# Patient Record
Sex: Male | Born: 1935 | Race: White | Hispanic: No | Marital: Married | Smoking: Never smoker
Health system: Southern US, Community
[De-identification: ages and names within clinical notes are randomized; demographics above are authoritative.]

## PROBLEM LIST (undated history)

## (undated) DIAGNOSIS — M1990 Unspecified osteoarthritis, unspecified site: Secondary | ICD-10-CM

## (undated) DIAGNOSIS — E119 Type 2 diabetes mellitus without complications: Secondary | ICD-10-CM

## (undated) DIAGNOSIS — Z85828 Personal history of other malignant neoplasm of skin: Secondary | ICD-10-CM

## (undated) DIAGNOSIS — R350 Frequency of micturition: Secondary | ICD-10-CM

## (undated) DIAGNOSIS — K219 Gastro-esophageal reflux disease without esophagitis: Secondary | ICD-10-CM

## (undated) DIAGNOSIS — C189 Malignant neoplasm of colon, unspecified: Secondary | ICD-10-CM

## (undated) DIAGNOSIS — B029 Zoster without complications: Secondary | ICD-10-CM

## (undated) DIAGNOSIS — G709 Myoneural disorder, unspecified: Secondary | ICD-10-CM

## (undated) DIAGNOSIS — I10 Essential (primary) hypertension: Secondary | ICD-10-CM

## (undated) DIAGNOSIS — C9110 Chronic lymphocytic leukemia of B-cell type not having achieved remission: Secondary | ICD-10-CM

## (undated) DIAGNOSIS — G4730 Sleep apnea, unspecified: Secondary | ICD-10-CM

## (undated) DIAGNOSIS — E039 Hypothyroidism, unspecified: Secondary | ICD-10-CM

## (undated) HISTORY — DX: Chronic lymphocytic leukemia of B-cell type not having achieved remission: C91.10

## (undated) HISTORY — DX: Zoster without complications: B02.9

## (undated) HISTORY — DX: Essential (primary) hypertension: I10

## (undated) HISTORY — DX: Hypothyroidism, unspecified: E03.9

---

## 1978-02-09 HISTORY — PX: HERNIA REPAIR: SHX51

## 2008-02-10 HISTORY — PX: COLON SURGERY: SHX602

## 2012-02-17 ENCOUNTER — Encounter: Payer: Self-pay | Admitting: Cardiology

## 2012-02-17 ENCOUNTER — Ambulatory Visit (INDEPENDENT_AMBULATORY_CARE_PROVIDER_SITE_OTHER): Payer: Medicare Other | Admitting: Cardiology

## 2012-02-17 VITALS — BP 140/68 | HR 84 | Ht 68.0 in | Wt 184.1 lb

## 2012-02-17 DIAGNOSIS — I10 Essential (primary) hypertension: Secondary | ICD-10-CM

## 2012-02-17 DIAGNOSIS — B029 Zoster without complications: Secondary | ICD-10-CM

## 2012-02-17 DIAGNOSIS — C9110 Chronic lymphocytic leukemia of B-cell type not having achieved remission: Secondary | ICD-10-CM

## 2012-02-17 DIAGNOSIS — R079 Chest pain, unspecified: Secondary | ICD-10-CM

## 2012-02-17 NOTE — Patient Instructions (Signed)
We will schedule you for a stress test  

## 2012-02-17 NOTE — Progress Notes (Signed)
   Lawrence Wilson Date of Birth: [birth year] Medical Record #[id number]  History of Present Illness: Lawrence Wilson is a pleasant 79 year old white male seen at the request of his son who is an emergency room physician here for evaluation of chest pain. He has a history of hypertension, hypothyroidism, and CLL. In mid-December he developed shingles involving his mid thorax and abdomen. Since this has occurred he has developed symptoms with exertion of sweating around his shoulders and chest. This is reproducible occurs several times a day. It may last as long as 20-30 minutes He denies any significant pain but does describe an unusual sensation in his chest with exertion. This can be with simple activities such as making a bed. He has no prior cardiac history. He apparently had a Stress test about 8 years ago and also had lower extremity Doppler studies which were normal.  Current Outpatient Prescriptions on File Prior to Visit  Medication Sig Dispense Refill  . levothyroxine (SYNTHROID, LEVOTHROID) 25 MCG tablet Take 25 mcg by mouth daily.      Marland Kitchen lisinopril-hydrochlorothiazide (PRINZIDE,ZESTORETIC) 20-12.5 MG per tablet Take 1 tablet by mouth daily.      . tadalafil (CIALIS) 20 MG tablet Take 20 mg by mouth daily as needed.        No Known Allergies  Past Medical History  Diagnosis Date  . Hypertension   . Hypothyroid   . CLL (chronic lymphocytic leukemia)   . Shingles     Past Surgical History  Procedure Date  . Hernia repair 1980  . Colon surgery 2010    cancerous polyp    History  Smoking status  . Never Smoker   Smokeless tobacco  . Not on file    History  Alcohol Use     History reviewed. No pertinent family history.  Review of Systems: The review of systems is positive for  recent shingles outbreak on his left trunk and abdomen.complains of numbness in his fingertips. He has been diagnosed with a low-grade CLL which is currently not being treated.   All other systems were  reviewed and are negative.  Physical Exam: BP 140/68  Pulse 84  Ht 5\' 8"  (1.727 m)  Wt 184 lb 1.9 oz (83.516 kg)  BMI 28.00 kg/m2 He is a pleasant white male in no acute distress. HEENT: Normocephalic, atraumatic. Pupils are equal round and reactive to light accommodation. Extraocular movements are full. His conjunctiva are reddened. Oropharynx is clear. Neck is without jugular venous distention, adenopathy, thyromegaly, or bruits. Lungs: Clear Cardiovascular: Regular rate and rhythm, normal S1 and S2, no gallop, murmur, or click. Abdomen: Soft and nontender. No masses or hepatosplenomegaly. Bowel sounds are positive. Extremities: No cyanosis or edema. Pedal pulses are 2+ and symmetric. Skin: Extensive scabbing herpetic rash involving the mid thorax radiating to the mid abdomen. Neuro: Alert and oriented x3. Cranial nerves II through XII are intact.  LABORATORY DATA: ECG today demonstrates normal sinus rhythm with rare PACs. Otherwise normal.  Assessment / Plan: 1. Atypical chest discomfort but exertional. Patient's risk factors include hypertension. I recommended a stress Myoview to further evaluate his cardiovascular risk. 2. Herpes zoster infection 3. CLL 4. Hypertension

## 2012-02-24 ENCOUNTER — Ambulatory Visit (HOSPITAL_COMMUNITY): Payer: Medicare Other | Attending: Cardiovascular Disease | Admitting: Radiology

## 2012-02-24 VITALS — BP 146/66 | HR 82 | Ht 69.0 in | Wt 184.0 lb

## 2012-02-24 DIAGNOSIS — R61 Generalized hyperhidrosis: Secondary | ICD-10-CM | POA: Insufficient documentation

## 2012-02-24 DIAGNOSIS — R079 Chest pain, unspecified: Secondary | ICD-10-CM

## 2012-02-24 DIAGNOSIS — I4949 Other premature depolarization: Secondary | ICD-10-CM

## 2012-02-24 DIAGNOSIS — C9110 Chronic lymphocytic leukemia of B-cell type not having achieved remission: Secondary | ICD-10-CM

## 2012-02-24 DIAGNOSIS — R0602 Shortness of breath: Secondary | ICD-10-CM

## 2012-02-24 DIAGNOSIS — I10 Essential (primary) hypertension: Secondary | ICD-10-CM | POA: Insufficient documentation

## 2012-02-24 DIAGNOSIS — B029 Zoster without complications: Secondary | ICD-10-CM

## 2012-02-24 MED ORDER — TECHNETIUM TC 99M SESTAMIBI GENERIC - CARDIOLITE
11.0000 | Freq: Once | INTRAVENOUS | Status: AC | PRN
  Administered 2012-02-24: 11 via INTRAVENOUS

## 2012-02-24 MED ORDER — TECHNETIUM TC 99M SESTAMIBI GENERIC - CARDIOLITE
33.0000 | Freq: Once | INTRAVENOUS | Status: AC | PRN
  Administered 2012-02-24: 33 via INTRAVENOUS

## 2012-02-24 NOTE — Progress Notes (Signed)
[facility] SITE 3 NUCLEAR MED [address] [place], [place] [address] [phone]    Cardiology Nuclear Med Study  Lawrence Wilson is a 79 y.o. male     MRN : [id number]     DOB: 1929/08/30  Procedure Date: 02/24/2012  Nuclear Med Background Indication for Stress Test:  Evaluation for Ischemia History:  '04 QMV:HQIONG Cardiac Risk Factors: Hypertension  Symptoms:  Diaphoresis with Exertion (last episode was yesterday in [employer], denies any chest pain)   Nuclear Pre-Procedure Caffeine/Decaff Intake:  None NPO After: 7:30am   Lungs:  Clear. O2 Sat: 97% on room air. IV 0.9% NS with Angio Cath:  22g  IV Site: R Antecubital  IV Started by:  Bonnita Levan, RN  Chest Size (in):  46 Cup Size: n/a  Height: 5\' 9"  (1.753 m)  Weight:  184 lb (83.462 kg)  BMI:  Body mass index is 27.17 kg/(m^2). Tech Comments:  N/A    Nuclear Med Study 1 or 2 day study: 1 day  Stress Test Type:  Stress  Reading MD: Kristeen Miss, MD  Order Authorizing Provider:  Peter Swaziland, MD  Resting Radionuclide: Technetium 62m Sestamibi  Resting Radionuclide Dose: 10.9 mCi   Stress Radionuclide:  Technetium 79m Sestamibi  Stress Radionuclide Dose: 32.8 mCi           Stress Protocol Rest HR: 82 Stress HR: 148  Rest BP: 146/66 Stress BP: 200/52  Exercise Time (min): 6:00 METS: 7.0   Predicted Max HR: 140 bpm % Max HR: 105.71 bpm Rate Pressure Product: 29528    Dose of Adenosine (mg):  n/a Dose of Lexiscan: n/a mg  Dose of Atropine (mg): n/a Dose of Dobutamine: n/a mcg/kg/min (at max HR)  Stress Test Technologist: Smiley Houseman, CMA-N  Nuclear Technologist:  Domenic Polite, CNMT     Rest Procedure:  Myocardial perfusion imaging was performed at rest 45 minutes following the intravenous administration of Technetium 66m Sestamibi.  Rest ECG: NSR - Normal EKG  Stress Procedure:  The patient exercised on the treadmill utilizing the Bruce Protocol for six minutes. The patient stopped due to dyspnea  and denied any chest pain.  Technetium 75m Sestamibi was injected at peak exercise and myocardial perfusion imaging was performed after a brief delay.  Stress ECG: No significant change from baseline ECG  QPS Raw Data Images:  Normal; no motion artifact; normal heart/lung ratio. Stress Images:  Normal homogeneous uptake in all areas of the myocardium. Rest Images:  Normal homogeneous uptake in all areas of the myocardium. Subtraction (SDS):  No evidence of ischemia. Transient Ischemic Dilatation (Normal <1.22):  0.97 Lung/Heart Ratio (Normal <0.45):  0.28  Quantitative Gated Spect Images QGS EDV:  82 ml QGS ESV:  29 ml  Impression Exercise Capacity:  Good exercise capacity. BP Response:  Hypertensive blood pressure response. Clinical Symptoms:  No significant symptoms noted. ECG Impression:  No significant ST segment change suggestive of ischemia. Comparison with Prior Nuclear Study: No previous nuclear study performed  Overall Impression:  Normal stress nuclear study.  No evidence of ischemia.  LV Ejection Fraction: 65%.  LV Wall Motion:  NL LV Function; NL Wall Motion.    Vesta Mixer, Montez Hageman., MD, [facility] 02/24/2012, 4:52 PM Office - [phone] Pager [phone]

## 2012-03-27 ENCOUNTER — Encounter (HOSPITAL_BASED_OUTPATIENT_CLINIC_OR_DEPARTMENT_OTHER): Payer: Self-pay | Admitting: Emergency Medicine

## 2012-03-27 ENCOUNTER — Emergency Department (HOSPITAL_BASED_OUTPATIENT_CLINIC_OR_DEPARTMENT_OTHER)
Admission: EM | Admit: 2012-03-27 | Discharge: 2012-03-27 | Disposition: A | Discharge status: Home / Self Care | Payer: Medicare Other | Attending: Emergency Medicine | Admitting: Emergency Medicine

## 2012-03-27 DIAGNOSIS — H538 Other visual disturbances: Secondary | ICD-10-CM | POA: Insufficient documentation

## 2012-03-27 DIAGNOSIS — R55 Syncope and collapse: Secondary | ICD-10-CM | POA: Insufficient documentation

## 2012-03-27 DIAGNOSIS — R51 Headache: Secondary | ICD-10-CM | POA: Insufficient documentation

## 2012-03-27 DIAGNOSIS — Z872 Personal history of diseases of the skin and subcutaneous tissue: Secondary | ICD-10-CM | POA: Insufficient documentation

## 2012-03-27 DIAGNOSIS — R42 Dizziness and giddiness: Secondary | ICD-10-CM | POA: Insufficient documentation

## 2012-03-27 DIAGNOSIS — Z8619 Personal history of other infectious and parasitic diseases: Secondary | ICD-10-CM | POA: Insufficient documentation

## 2012-03-27 DIAGNOSIS — R109 Unspecified abdominal pain: Secondary | ICD-10-CM | POA: Insufficient documentation

## 2012-03-27 DIAGNOSIS — R21 Rash and other nonspecific skin eruption: Secondary | ICD-10-CM | POA: Insufficient documentation

## 2012-03-27 DIAGNOSIS — Z79899 Other long term (current) drug therapy: Secondary | ICD-10-CM | POA: Insufficient documentation

## 2012-03-27 DIAGNOSIS — E039 Hypothyroidism, unspecified: Secondary | ICD-10-CM | POA: Insufficient documentation

## 2012-03-27 DIAGNOSIS — I10 Essential (primary) hypertension: Secondary | ICD-10-CM | POA: Insufficient documentation

## 2012-03-27 DIAGNOSIS — D72829 Elevated white blood cell count, unspecified: Secondary | ICD-10-CM

## 2012-03-27 LAB — URINALYSIS, ROUTINE W REFLEX MICROSCOPIC
Bilirubin Urine: NEGATIVE
Glucose, UA: NEGATIVE mg/dL
Hgb urine dipstick: NEGATIVE
Ketones, ur: NEGATIVE mg/dL
Leukocytes, UA: NEGATIVE
Protein, ur: NEGATIVE mg/dL
pH: 5.5 (ref 5.0–8.0)

## 2012-03-27 LAB — CBC WITH DIFFERENTIAL/PLATELET
Basophils Absolute: 0 10*3/uL (ref 0.0–0.1)
Basophils Relative: 0 % (ref 0–1)
Eosinophils Absolute: 0 10*3/uL (ref 0.0–0.7)
HCT: 38.1 % — ABNORMAL LOW (ref 39.0–52.0)
Hemoglobin: 12.5 g/dL — ABNORMAL LOW (ref 13.0–17.0)
Lymphs Abs: 30.4 10*3/uL — ABNORMAL HIGH (ref 0.7–4.0)
MCH: 31 pg (ref 26.0–34.0)
MCHC: 32.8 g/dL (ref 30.0–36.0)
MCV: 94.5 fL (ref 78.0–100.0)
Monocytes Absolute: 1.2 10*3/uL — ABNORMAL HIGH (ref 0.1–1.0)
Neutro Abs: 9.5 10*3/uL — ABNORMAL HIGH (ref 1.7–7.7)
RDW: 15.9 % — ABNORMAL HIGH (ref 11.5–15.5)

## 2012-03-27 LAB — BASIC METABOLIC PANEL
BUN: 33 mg/dL — ABNORMAL HIGH (ref 6–23)
Chloride: 101 mEq/L (ref 96–112)
Creatinine, Ser: 1.3 mg/dL (ref 0.50–1.35)
GFR calc Af Amer: 58 mL/min — ABNORMAL LOW (ref >90)
Glucose, Bld: 136 mg/dL — ABNORMAL HIGH (ref 70–99)
Potassium: 4.7 mEq/L (ref 3.5–5.1)

## 2012-03-27 MED ORDER — SODIUM CHLORIDE 0.9 % IV BOLUS (SEPSIS)
500.0000 mL | Freq: Once | INTRAVENOUS | Status: AC
  Administered 2012-03-27: 500 mL via INTRAVENOUS

## 2012-03-27 NOTE — ED Provider Notes (Signed)
History  This chart was scribed for Lawrence Racer, MD by Bennett Scrape, ED Scribe. This patient was seen in room MH05/MH05 and the patient's care was started at 12:48 PM.  CSN: [id number]  Arrival date & time 03/27/12  1231   First MD Initiated Contact with Patient 03/27/12 1248      Chief Complaint  Patient presents with  . Near Syncope     Patient is a 79 y.o. male presenting with syncope. The history is provided by the patient. No language interpreter was used.  Loss of Consciousness  This is a new problem. The current episode started less than 1 hour ago. The problem has been resolved. He lost consciousness for a period of less than one minute. The problem is associated with an unknown factor. Associated symptoms include abdominal pain (associated with rash, no new changes), headaches, light-headedness and visual change. Pertinent negatives include chest pain, fever, nausea, seizures and vomiting.    Lawrence Wilson is a 79 y.o. male brought in by ambulance, who presents to the Emergency Department complaining of one episode of syncope with associated precursor symptoms of lightheadedness, mild HA and blurred vision that occurred while at church less than one hour PTA. The episode was witnessed by pt's wife who states that he was standing and then sat down in the pew. Pt denies extended periods of standing and states that he had been feeling lightheaded for 15 minutes prior to the episode. He denies neck or head trauma. He reports that he took one Cialis pill at 8 AM along with his other daily medications (lisionpril and synthroid). He denies having prior episodes of similar symptoms. He states that he has improving an improving zoster rash to his abdomen but denies any recent fevers, chills, nausea and emesis as associated symptoms. He has a h/o HTN, CLL and hypothyroidism and he denies smoking.  Has been seen by Dr. Swaziland at [facility] before during herpes zoster onset in December  2013   Past Medical History  Diagnosis Date  . Hypertension   . Hypothyroid   . CLL (chronic lymphocytic leukemia)   . Shingles     Past Surgical History  Procedure Laterality Date  . Hernia repair  1980  . Colon surgery  2010    cancerous polyp    No family history on file.  History  Substance Use Topics  . Smoking status: Never Smoker   . Smokeless tobacco: Not on file  . Alcohol Use:       Review of Systems  Constitutional: Negative for fever and chills.  Cardiovascular: Positive for syncope. Negative for chest pain.  Gastrointestinal: Positive for abdominal pain (associated with rash, no new changes). Negative for nausea and vomiting.  Skin: Positive for pallor (ongoing, no new changes).  Neurological: Positive for syncope, light-headedness and headaches. Negative for seizures and speech difficulty.  All other systems reviewed and are negative.    Allergies  Review of patient's allergies indicates no known allergies.  Home Medications   Current Outpatient Rx  Name  Route  Sig  Dispense  Refill  . tadalafil (CIALIS) 20 MG tablet   Oral   Take 20 mg by mouth daily as needed.         Marland Kitchen levothyroxine (SYNTHROID, LEVOTHROID) 25 MCG tablet   Oral   Take 25 mcg by mouth daily.         Marland Kitchen lisinopril-hydrochlorothiazide (PRINZIDE,ZESTORETIC) 20-12.5 MG per tablet   Oral   Take 1 tablet by mouth  daily.         . Tamsulosin HCl (FLOMAX) 0.4 MG CAPS   Oral   Take 0.4 mg by mouth daily after supper.         . traMADol (ULTRAM) 50 MG tablet   Oral   Take 50 mg by mouth every 6 (six) hours as needed.           BP 119/53  Pulse 70  Temp(Src) 98 F (36.7 C) (Oral)  Resp 22  SpO2 100%  Physical Exam  Nursing note and vitals reviewed. Constitutional: He is oriented to person, place, and time. He appears well-developed and well-nourished. No distress.  HENT:  Head: Normocephalic and atraumatic.  Mouth/Throat: Oropharynx is clear and moist.   Eyes: Conjunctivae and EOM are normal. Pupils are equal, round, and reactive to light.  Neck: Neck supple. No tracheal deviation present.  Cardiovascular: Normal rate and regular rhythm.   No murmur heard. Pulmonary/Chest: Effort normal and breath sounds normal. No respiratory distress.  Abdominal: Soft. There is no tenderness.  Musculoskeletal: Normal range of motion.  Neurological: He is alert and oriented to person, place, and time. No cranial nerve deficit.  Sensation is intact, 5/5 strength throughout, equal grip strengths   Skin: Skin is warm and dry. Rash noted.  Old healing zoster scar to the left abdomen  Psychiatric: He has a normal mood and affect. His behavior is normal.    ED Course  Procedures (including critical care time)  DIAGNOSTIC STUDIES COORDINATION OF CARE: 1:12 PM-Discussed treatment plan which includes IV fluids, CBC panel, UA and troponin with pt at bedside and pt agreed to plan.   1:15 PM- Ordered 500 mL of bolus  Labs Reviewed  BASIC METABOLIC PANEL - Abnormal; Notable for the following:    Glucose, Bld 136 (*)    BUN 33 (*)    GFR calc non Af Amer 50 (*)    GFR calc Af Amer 58 (*)    All other components within normal limits  CBC WITH DIFFERENTIAL - Abnormal; Notable for the following:    WBC 41.1 (*)    RBC 4.03 (*)    Hemoglobin 12.5 (*)    HCT 38.1 (*)    RDW 15.9 (*)    Neutrophils Relative 23 (*)    Lymphocytes Relative 74 (*)    Neutro Abs 9.5 (*)    Lymphs Abs 30.4 (*)    Monocytes Absolute 1.2 (*)    All other components within normal limits  GLUCOSE, CAPILLARY - Abnormal; Notable for the following:    Glucose-Capillary 118 (*)    All other components within normal limits  TROPONIN I  URINALYSIS, ROUTINE W REFLEX MICROSCOPIC  PATHOLOGIST SMEAR REVIEW   No results found.   1. Near syncope   2. Leukocytosis       Date: 03/27/2012  Rate: 76  Rhythm: normal sinus rhythm and sinus arrhythmia  QRS Axis: normal  Intervals:  normal  ST/T Wave abnormalities: normal  Conduction Disutrbances:none  Narrative Interpretation:   Old EKG Reviewed: unchanged   MDM  I personally performed the services described in this documentation, which was scribed in my presence. The recorded information has been reviewed and is accurate.  Pt runs elevated WBC due to    Lawrence Racer, MD 03/30/12 [id number]

## 2012-03-27 NOTE — ED Notes (Signed)
Pt standing at church, became dizzy and vision blurry.  Pt states he remembers everything.  Pt did not eat breakfast.

## 2012-03-27 NOTE — ED Notes (Signed)
Pt ambulated in the hallway with no return of dizziness.

## 2012-09-14 ENCOUNTER — Other Ambulatory Visit: Payer: Self-pay

## 2012-12-15 ENCOUNTER — Other Ambulatory Visit: Payer: Self-pay

## 2013-02-09 HISTORY — PX: CATARACT EXTRACTION: SUR2

## 2014-02-20 ENCOUNTER — Ambulatory Visit: Payer: Self-pay | Admitting: Orthopedic Surgery

## 2014-02-20 NOTE — Progress Notes (Signed)
Please put orders in Epic surgery 03-05-14 pre op 02-26-14 Thanks 

## 2014-02-20 NOTE — Progress Notes (Signed)
Preoperative surgical orders have been place into the Epic hospital system for Lawrence Wilson on 02/20/2014, 1:52 PM  by Mickel Crow for surgery on 03-05-2014.  Preop Total Knee orders including Experal, IV Tylenol, and IV Decadron as long as there are no contraindications to the above medications. Arlee Muslim, PA-C

## 2014-02-21 ENCOUNTER — Encounter (HOSPITAL_COMMUNITY): Payer: Self-pay

## 2014-02-23 NOTE — Patient Instructions (Addendum)
Lawrence Wilson  02/23/2014   Your procedure is scheduled on: 03/05/2014    Report to [facility] Main  Entrance and follow signs to               [place] at     1045 AM.  Call this number if you have problems the morning of surgery [phone]   Remember: May have clear liquids until 0700am then nothing by mouth.    Do not eat food after midnite.       Take these medicines the morning of surgery with A SIP OF WATER:Synthroid                                 You may not have any metal on your body including hair pins and              piercings  Do not wear jewelry,  lotions, powders or perfumes., deodorant                          Men may shave face and neck.   Do not bring valuables to the hospital. [place].  Contacts, dentures or bridgework may not be worn into surgery.  Leave suitcase in the car. After surgery it may be brought to your room.       Special Instructions:coughing and deep breathing exercises, leg exercises               Please read over the following fact sheets you were given: _____________________________________________________________________                CLEAR LIQUID DIET   Foods Allowed                                                                     Foods Excluded  Coffee and tea, regular and decaf                             liquids that you cannot  Plain Jell-O in any flavor                                             see through such as: Fruit ices (not with fruit pulp)                                     milk, soups, orange juice  Iced Popsicles                                    All solid food Carbonated beverages, regular and diet  Cranberry, grape and apple juices Sports drinks like Gatorade Lightly seasoned clear broth or consume(fat free) Sugar, honey syrup  Sample Menu Breakfast                                Lunch                                      Supper Cranberry juice                    Beef broth                            Chicken broth Jell-O                                     Grape juice                           Apple juice Coffee or tea                        Jell-O                                      Popsicle                                                Coffee or tea                        Coffee or tea  _____________________________________________________________________  [facility] - Preparing for Surgery Before surgery, you can play an important role.  Because skin is not sterile, your skin needs to be as free of germs as possible.  You can reduce the number of germs on your skin by washing with CHG (chlorahexidine gluconate) soap before surgery.  CHG is an antiseptic cleaner which kills germs and bonds with the skin to continue killing germs even after washing. Please DO NOT use if you have an allergy to CHG or antibacterial soaps.  If your skin becomes reddened/irritated stop using the CHG and inform your nurse when you arrive at Short Stay. Do not shave (including legs and underarms) for at least 48 hours prior to the first CHG shower.  You may shave your face/neck. Please follow these instructions carefully:  1.  Shower with CHG Soap the night before surgery and the  morning of Surgery.  2.  If you choose to wash your hair, wash your hair first as usual with your  normal  shampoo.  3.  After you shampoo, rinse your hair and body thoroughly to remove the  shampoo.                           4.  Use CHG as you would any other liquid soap.  You can apply chg directly  to the skin and wash  Gently with a scrungie or clean washcloth.  5.  Apply the CHG Soap to your body ONLY FROM THE NECK DOWN.   Do not use on face/ open                           Wound or open sores. Avoid contact with eyes, ears mouth and genitals (private parts).                       Wash face,   Genitals (private parts) with your normal soap.             6.  Wash thoroughly, paying special attention to the area where your surgery  will be performed.  7.  Thoroughly rinse your body with warm water from the neck down.  8.  DO NOT shower/wash with your normal soap after using and rinsing off  the CHG Soap.                9.  Pat yourself dry with a clean towel.            10.  Wear clean pajamas.            11.  Place clean sheets on your bed the night of your first shower and do not  sleep with pets. Day of Surgery : Do not apply any lotions/deodorants the morning of surgery.  Please wear clean clothes to the hospital/surgery center.  FAILURE TO FOLLOW THESE INSTRUCTIONS MAY RESULT IN THE CANCELLATION OF YOUR SURGERY PATIENT SIGNATURE_________________________________  NURSE SIGNATURE__________________________________  ________________________________________________________________________  WHAT IS A BLOOD TRANSFUSION? Blood Transfusion Information  A transfusion is the replacement of blood or some of its parts. Blood is made up of multiple cells which provide different functions.  Red blood cells carry oxygen and are used for blood loss replacement.  White blood cells fight against infection.  Platelets control bleeding.  Plasma helps clot blood.  Other blood products are available for specialized needs, such as hemophilia or other clotting disorders. BEFORE THE TRANSFUSION  Who gives blood for transfusions?   Healthy volunteers who are fully evaluated to make sure their blood is safe. This is blood bank blood. Transfusion therapy is the safest it has ever been in the practice of medicine. Before blood is taken from a donor, a complete history is taken to make sure that person has no history of diseases nor engages in risky social behavior (examples are intravenous drug use or sexual activity with multiple partners). The donor's travel history is screened to minimize risk of  transmitting infections, such as malaria. The donated blood is tested for signs of infectious diseases, such as HIV and hepatitis. The blood is then tested to be sure it is compatible with you in order to minimize the chance of a transfusion reaction. If you or a relative donates blood, this is often done in anticipation of surgery and is not appropriate for emergency situations. It takes many days to process the donated blood. RISKS AND COMPLICATIONS Although transfusion therapy is very safe and saves many lives, the main dangers of transfusion include:  1. Getting an infectious disease. 2. Developing a transfusion reaction. This is an allergic reaction to something in the blood you were given. Every precaution is taken to prevent this. The decision to have a blood transfusion has been considered carefully by your caregiver before blood is given. Blood is not given unless the benefits  outweigh the risks. AFTER THE TRANSFUSION  Right after receiving a blood transfusion, you will usually feel much better and more energetic. This is especially true if your red blood cells have gotten low (anemic). The transfusion raises the level of the red blood cells which carry oxygen, and this usually causes an energy increase.  The nurse administering the transfusion will monitor you carefully for complications. HOME CARE INSTRUCTIONS  No special instructions are needed after a transfusion. You may find your energy is better. Speak with your caregiver about any limitations on activity for underlying diseases you may have. SEEK MEDICAL CARE IF:   Your condition is not improving after your transfusion.  You develop redness or irritation at the intravenous (IV) site. SEEK IMMEDIATE MEDICAL CARE IF:  Any of the following symptoms occur over the next 12 hours:  Shaking chills.  You have a temperature by mouth above 102 F (38.9 C), not controlled by medicine.  Chest, back, or muscle pain.  People around you  feel you are not acting correctly or are confused.  Shortness of breath or difficulty breathing.  Dizziness and fainting.  You get a rash or develop hives.  You have a decrease in urine output.  Your urine turns a dark color or changes to pink, red, or brown. Any of the following symptoms occur over the next 10 days:  You have a temperature by mouth above 102 F (38.9 C), not controlled by medicine.  Shortness of breath.  Weakness after normal activity.  The white part of the eye turns yellow (jaundice).  You have a decrease in the amount of urine or are urinating less often.  Your urine turns a dark color or changes to pink, red, or brown. Document Released: 01/24/2000 Document Revised: 04/20/2011 Document Reviewed: 09/12/2007 ExitCare Patient Information 2014 [place].  _______________________________________________________________________  Incentive Spirometer  An incentive spirometer is a tool that can help keep your lungs clear and active. This tool measures how well you are filling your lungs with each breath. Taking long deep breaths may help reverse or decrease the chance of developing breathing (pulmonary) problems (especially infection) following:  A long period of time when you are unable to move or be active. BEFORE THE PROCEDURE   If the spirometer includes an indicator to show your best effort, your nurse or respiratory therapist will set it to a desired goal.  If possible, sit up straight or lean slightly forward. Try not to slouch.  Hold the incentive spirometer in an upright position. INSTRUCTIONS FOR USE  3. Sit on the edge of your bed if possible, or sit up as far as you can in bed or on a chair. 4. Hold the incentive spirometer in an upright position. 5. Breathe out normally. 6. Place the mouthpiece in your mouth and seal your lips tightly around it. 7. Breathe in slowly and as deeply as possible, raising the piston or the ball toward the top  of the column. 8. Hold your breath for 3-5 seconds or for as long as possible. Allow the piston or ball to fall to the bottom of the column. 9. Remove the mouthpiece from your mouth and breathe out normally. 10. Rest for a few seconds and repeat Steps 1 through 7 at least 10 times every 1-2 hours when you are awake. Take your time and take a few normal breaths between deep breaths. 11. The spirometer may include an indicator to show your best effort. Use the indicator as a goal to  work toward during each repetition. 12. After each set of 10 deep breaths, practice coughing to be sure your lungs are clear. If you have an incision (the cut made at the time of surgery), support your incision when coughing by placing a pillow or rolled up towels firmly against it. Once you are able to get out of bed, walk around indoors and cough well. You may stop using the incentive spirometer when instructed by your caregiver.  RISKS AND COMPLICATIONS  Take your time so you do not get dizzy or light-headed.  If you are in pain, you may need to take or ask for pain medication before doing incentive spirometry. It is harder to take a deep breath if you are having pain. AFTER USE  Rest and breathe slowly and easily.  It can be helpful to keep track of a log of your progress. Your caregiver can provide you with a simple table to help with this. If you are using the spirometer at home, follow these instructions: [place] IF:   You are having difficultly using the spirometer.  You have trouble using the spirometer as often as instructed.  Your pain medication is not giving enough relief while using the spirometer.  You develop fever of 100.5 F (38.1 C) or higher. SEEK IMMEDIATE MEDICAL CARE IF:   You cough up bloody sputum that had not been present before.  You develop fever of 102 F (38.9 C) or greater.  You develop worsening pain at or near the incision site. MAKE SURE YOU:   Understand  these instructions.  Will watch your condition.  Will get help right away if you are not doing well or get worse. Document Released: 06/08/2006 Document Revised: 04/20/2011 Document Reviewed: 08/09/2006 [facility] Patient Information 2014 [place], [place].   ________________________________________________________________________

## 2014-02-26 ENCOUNTER — Encounter (HOSPITAL_COMMUNITY)
Admission: RE | Admit: 2014-02-26 | Discharge: 2014-02-26 | Disposition: A | Discharge status: Home / Self Care | Payer: Medicare Other | Source: Ambulatory Visit | Attending: Orthopedic Surgery | Admitting: Orthopedic Surgery

## 2014-02-26 ENCOUNTER — Encounter (HOSPITAL_COMMUNITY): Payer: Self-pay

## 2014-02-26 ENCOUNTER — Ambulatory Visit (HOSPITAL_COMMUNITY)
Admission: RE | Admit: 2014-02-26 | Discharge: 2014-02-26 | Disposition: A | Discharge status: Home / Self Care | Payer: Medicare Other | Source: Ambulatory Visit | Attending: Orthopedic Surgery | Admitting: Orthopedic Surgery

## 2014-02-26 DIAGNOSIS — Z01818 Encounter for other preprocedural examination: Secondary | ICD-10-CM

## 2014-02-26 HISTORY — DX: Frequency of micturition: R35.0

## 2014-02-26 HISTORY — DX: Sleep apnea, unspecified: G47.30

## 2014-02-26 HISTORY — DX: Unspecified osteoarthritis, unspecified site: M19.90

## 2014-02-26 HISTORY — DX: Myoneural disorder, unspecified: G70.9

## 2014-02-26 LAB — COMPREHENSIVE METABOLIC PANEL
ALBUMIN: 4.2 g/dL (ref 3.5–5.2)
ALT: 46 U/L (ref 0–53)
AST: 35 U/L (ref 0–37)
Alkaline Phosphatase: 59 U/L (ref 39–117)
Anion gap: 8 (ref 5–15)
BUN: 30 mg/dL — AB (ref 6–23)
CHLORIDE: 108 meq/L (ref 96–112)
CO2: 27 mmol/L (ref 19–32)
Calcium: 8.9 mg/dL (ref 8.4–10.5)
Creatinine, Ser: 1.23 mg/dL (ref 0.50–1.35)
GFR calc non Af Amer: 53 mL/min — ABNORMAL LOW (ref >90)
GFR, EST AFRICAN AMERICAN: 61 mL/min — AB (ref >90)
GLUCOSE: 159 mg/dL — AB (ref 70–99)
POTASSIUM: 4.8 mmol/L (ref 3.5–5.1)
SODIUM: 143 mmol/L (ref 135–145)
TOTAL PROTEIN: 6.5 g/dL (ref 6.0–8.3)
Total Bilirubin: 0.6 mg/dL (ref 0.3–1.2)

## 2014-02-26 LAB — CBC
HEMATOCRIT: 42.5 % (ref 39.0–52.0)
HEMOGLOBIN: 14 g/dL (ref 13.0–17.0)
MCH: 30.8 pg (ref 26.0–34.0)
MCHC: 32.9 g/dL (ref 30.0–36.0)
MCV: 93.6 fL (ref 78.0–100.0)
Platelets: 195 10*3/uL (ref 150–400)
RBC: 4.54 MIL/uL (ref 4.22–5.81)
RDW: 14.2 % (ref 11.5–15.5)
WBC: 16.7 10*3/uL — AB (ref 4.0–10.5)

## 2014-02-26 LAB — URINALYSIS, ROUTINE W REFLEX MICROSCOPIC
BILIRUBIN URINE: NEGATIVE
GLUCOSE, UA: NEGATIVE mg/dL
Hgb urine dipstick: NEGATIVE
Ketones, ur: NEGATIVE mg/dL
LEUKOCYTES UA: NEGATIVE
Nitrite: NEGATIVE
Protein, ur: NEGATIVE mg/dL
SPECIFIC GRAVITY, URINE: 1.024 (ref 1.005–1.030)
Urobilinogen, UA: 0.2 mg/dL (ref 0.0–1.0)
pH: 6.5 (ref 5.0–8.0)

## 2014-02-26 LAB — SURGICAL PCR SCREEN
MRSA, PCR: NEGATIVE
Staphylococcus aureus: NEGATIVE

## 2014-02-26 LAB — PROTIME-INR
INR: 1.01 (ref 0.00–1.49)
Prothrombin Time: 13.4 seconds (ref 11.6–15.2)

## 2014-02-26 LAB — APTT: APTT: 27 s (ref 24–37)

## 2014-02-26 NOTE — Progress Notes (Signed)
EKG-01/01/14- chart  Clearance- Dr Suzy Bouchard- 01/01/14 on chart with office visit note  Stress 1.15.14 EPIC

## 2014-02-26 NOTE — Progress Notes (Signed)
CMP doen 02/26/14 faxed via EPIC to Dr Wynelle Link.

## 2014-03-04 ENCOUNTER — Ambulatory Visit: Payer: Self-pay | Admitting: Orthopedic Surgery

## 2014-03-04 NOTE — H&P (Signed)
Lawrence Wilson DOB: 03/13/1929 Married / Language: [name] / Race: White Male Date of Admission:  03/05/2014 CC:  Right Knee Pain History of Present Illness The patient is a 81 year old male who comes in  for a preoperative History and Physical. The patient is scheduled for a right total knee arthroplasty to be performed by Dr. [name]. [name], MD at [facility] on 03-05-2014. The patient is a 81 year old male who presents with knee complaints. The patient was seen  for a second opinion (for right greater than left knee pain). The patient reports left knee and right knee symptoms including: pain, weakness, stiffness and soreness which began year(s) ago without any known injury. The patient has the current diagnosis of knee osteoarthritis. Prior to being seen today the patient was previously evaluated by a colleague (Dr. [name]). Previous work-up for this problem has included knee x-rays. Past treatment for this problem has included intra-articular injection of corticosteroids. The patient was last seen in clinic 4 month(s) ago. He has had more trouble with the knees in the past 2 years, right greater than left. He feels that the right knee is weaker than the left. He denies groin pain. No injuries to the knees. He has had a couple cortisone injections in the knee before, last in May, which did not help at all. He typically only has pain with weightbearing, not at rest. He is to the point now that he would like to move forward to TKA. They have been treated conservatively in the past for the above stated problem and despite conservative measures, they continue to have progressive pain and severe functional limitations and dysfunction. They have failed non-operative management including home exercise, medications, and injections. It is felt that they would benefit from undergoing total joint replacement. Risks and benefits of the procedure have been discussed with the patient and they elect to proceed  with surgery. There are no active contraindications to surgery such as ongoing infection or rapidly progressive neurological disease.  Problem List/Past Medical  Osteoarthritis of right knee (M17.9) Primary osteoarthritis of both knees (M17.0) Chronic lymphocytic leukemia (C91.10) Skin Cancer High blood pressure Peripheral Neuropathy Shingles Cataract Bilateral Sleep Apnea uses CPAP Colon Cancer Measles Mumps Benign Neoplasm of Colon Chronic Rhinitis Contact Dermatitis Enlarged Prostate Herpes Zoster Hypothyroidism Malignant Neoplasm of the Skin Impaired Memory Myopia Both Eyes Chronic kidney disease Stage III  Allergies  No Known Drug Allergies  Family History ( Cancer Mother, Sister. Hypertension Mother. Congestive Heart Failure Father. Heart Disease Brother, Father.  Social History Exercise Exercises weekly; does other and gym / weights Living situation live with spouse Marital status married Current drinker 10/20/2013: Currently drinks beer only occasionally per week Current work status retired Tobacco use Never smoker. 10/20/2013 No history of drug/alcohol rehab Not under pain contract Number of flights of stairs before winded greater than 5 Advance Directives Living Will, [place]. He is a resident of [place].  Medication History  Levothyroxine Sodium (25MCG Tablet, Oral) Active. Lisinopril-Hydrochlorothiazide (20-12.5MG  Tablet, Oral) Active. Tamsulosin HCl (0.4MG  Capsule, Oral) Active. Fish Oil Active. Multivitamin (Oral) Active. Aleve (220MG  Tablet, Oral) Active.  Past Surgical History Inguinal Hernia Repair Date: 1980. open: left Tonsillectomy Colon Polyp Removal - Open Colectomy Date: 2010. partial Colon Polyp Removal - Colonoscopy Adnoidectomy   Review of Systems  General Not Present- Chills, Fatigue, Fever, Memory Loss, Night Sweats, Weight Gain and Weight  Loss. Skin Not Present- Eczema, Hives, Itching, Lesions and Rash. HEENT  Not Present- Dentures, Double Vision, Headache, Hearing Loss, Tinnitus and Visual Loss. Respiratory Not Present- Allergies, Chronic Cough, Coughing up blood, Shortness of breath at rest and Shortness of breath with exertion. Cardiovascular Not Present- Chest Pain, Difficulty Breathing Lying Down, Murmur, Palpitations, Racing/skipping heartbeats and Swelling. Gastrointestinal Present- Heartburn (slight). Not Present- Abdominal Pain, Bloody Stool, Constipation, Diarrhea, Difficulty Swallowing, Jaundice, Loss of appetitie, Nausea and Vomiting. Male Genitourinary Present- Urinary frequency, Urinary Retention and Urinating at Night. Not Present- Blood in Urine, Discharge, Flank Pain, Incontinence, Painful Urination, Urgency and Weak urinary stream. Musculoskeletal Present- Joint Pain and Morning Stiffness. Not Present- Back Pain, Joint Swelling, Muscle Pain, Muscle Weakness and Spasms. Neurological Not Present- Blackout spells, Difficulty with balance, Dizziness, Paralysis, Tremor and Weakness. Psychiatric Not Present- Insomnia.   Vitals Weight: 190 lb Height: 69in Weight was reported by patient. Height was reported by patient. Body Surface Area: 2.02 m Body Mass Index: 28.06 kg/m  BP: 142/72 (Sitting, Left Arm, Standard)   Physical Exam  General Mental Status -Alert, cooperative and good historian. General Appearance-pleasant, Not in acute distress. Orientation-Oriented X3. Build & Nutrition-Well nourished and Well developed.  Head and Neck Head-normocephalic, atraumatic . Neck Global Assessment - supple, no bruit auscultated on the right, no bruit auscultated on the left.  Eye Pupil - Bilateral-Regular and Round. Motion - Bilateral-EOMI.  Chest and Lung Exam Auscultation Breath sounds - clear at anterior chest wall and clear at posterior chest wall. Adventitious sounds - No Adventitious  sounds.  Cardiovascular Auscultation Rhythm - Regular rate and rhythm. Heart Sounds - S1 WNL and S2 WNL. Murmurs & Other Heart Sounds - Auscultation of the heart reveals - No Murmurs.  Abdomen Palpation/Percussion Tenderness - Abdomen is non-tender to palpation. Rigidity (guarding) - Abdomen is soft. Auscultation Auscultation of the abdomen reveals - Bowel sounds normal.  Male Genitourinary Note: Not done, not pertinent to present illness   Musculoskeletal Note: He is a well developed male. He is alert and oriented. No apparent distress. Both hips show a normal range of motion. No discomfort. The right knee shows no effusion. Range is about 5-120 with marked crepitus on range of motion. There is tenderness medial greater than lateral. No instability noted.  The left knee with no effusion. Range about 5-120. Moderate crepitus on range of motion. There is tenderness medial greater than lateral. No instability. Pulse, sensation and motor are intact in both lower extremities.  RADIOGRAPHS: AP both knees and lateral show severe advanced bone on bone changes in both knees with tibial subluxation of both. Right is worse than left knee.   Assessment & Plan  Osteoarthritis of right knee (M17.9) Note:Plan is for a Right Total Knee Replacement by Dr. Wynelle Link.  Plan is to go home.  PCP - Dr. Suzy Bouchard - Patient has been seen preoperatively and felt to be stable for surgery. "Patient has no active cardiac symptoms and maintains independent functional status. No acute changes on EKG in office today." "Patient advised he may need to hold his lisinopril-HCTZ the day of surgery, but will defer perioperative medication management to operative team."  Hem/Onc - Dr. Jacqulyn Ducking - HGB 12.9 noted on 01/15/2014  Topical TXA - Colon Cancer  Signed electronically by Joelene Millin, III PA-C

## 2014-03-05 ENCOUNTER — Encounter (HOSPITAL_COMMUNITY): Payer: Self-pay | Admitting: *Deleted

## 2014-03-05 ENCOUNTER — Encounter (HOSPITAL_COMMUNITY)
Admission: RE | Disposition: A | Discharge status: Home Health | Payer: Self-pay | Source: Ambulatory Visit | Attending: Orthopedic Surgery

## 2014-03-05 ENCOUNTER — Inpatient Hospital Stay (HOSPITAL_COMMUNITY): Payer: Medicare Other | Admitting: Certified Registered Nurse Anesthetist

## 2014-03-05 ENCOUNTER — Inpatient Hospital Stay (HOSPITAL_COMMUNITY)
Admission: RE | Admit: 2014-03-05 | Discharge: 2014-03-08 | DRG: 470 | Disposition: A | Discharge status: Home Health | Payer: Medicare Other | Source: Ambulatory Visit | Attending: Orthopedic Surgery | Admitting: Orthopedic Surgery

## 2014-03-05 DIAGNOSIS — G4730 Sleep apnea, unspecified: Secondary | ICD-10-CM | POA: Diagnosis present

## 2014-03-05 DIAGNOSIS — G629 Polyneuropathy, unspecified: Secondary | ICD-10-CM | POA: Diagnosis present

## 2014-03-05 DIAGNOSIS — Z79899 Other long term (current) drug therapy: Secondary | ICD-10-CM

## 2014-03-05 DIAGNOSIS — M170 Bilateral primary osteoarthritis of knee: Principal | ICD-10-CM | POA: Diagnosis present

## 2014-03-05 DIAGNOSIS — E039 Hypothyroidism, unspecified: Secondary | ICD-10-CM | POA: Diagnosis present

## 2014-03-05 DIAGNOSIS — N183 Chronic kidney disease, stage 3 (moderate): Secondary | ICD-10-CM | POA: Diagnosis present

## 2014-03-05 DIAGNOSIS — M25561 Pain in right knee: Secondary | ICD-10-CM | POA: Diagnosis present

## 2014-03-05 DIAGNOSIS — M179 Osteoarthritis of knee, unspecified: Secondary | ICD-10-CM | POA: Diagnosis present

## 2014-03-05 DIAGNOSIS — C9110 Chronic lymphocytic leukemia of B-cell type not having achieved remission: Secondary | ICD-10-CM | POA: Diagnosis present

## 2014-03-05 DIAGNOSIS — I129 Hypertensive chronic kidney disease with stage 1 through stage 4 chronic kidney disease, or unspecified chronic kidney disease: Secondary | ICD-10-CM | POA: Diagnosis present

## 2014-03-05 DIAGNOSIS — M1711 Unilateral primary osteoarthritis, right knee: Secondary | ICD-10-CM

## 2014-03-05 DIAGNOSIS — M1710 Unilateral primary osteoarthritis, unspecified knee: Secondary | ICD-10-CM | POA: Diagnosis present

## 2014-03-05 HISTORY — PX: TOTAL KNEE ARTHROPLASTY: SHX125

## 2014-03-05 LAB — TYPE AND SCREEN
ABO/RH(D): B POS
ANTIBODY SCREEN: NEGATIVE

## 2014-03-05 LAB — ABO/RH: ABO/RH(D): B POS

## 2014-03-05 SURGERY — ARTHROPLASTY, KNEE, TOTAL
Anesthesia: Spinal | Site: Knee | Laterality: Right

## 2014-03-05 MED ORDER — FENTANYL CITRATE 0.05 MG/ML IJ SOLN: 25.0000 ug | INTRAMUSCULAR | Status: DC | PRN

## 2014-03-05 MED ORDER — POLYETHYLENE GLYCOL 3350 17 G PO PACK: 17.0000 g | PACK | Freq: Every day | ORAL | Status: DC | PRN

## 2014-03-05 MED ORDER — ACETAMINOPHEN 325 MG PO TABS: 650.0000 mg | ORAL_TABLET | Freq: Four times a day (QID) | ORAL | Status: DC | PRN

## 2014-03-05 MED ORDER — LEVOTHYROXINE SODIUM 25 MCG PO TABS
25.0000 ug | ORAL_TABLET | Freq: Every day | ORAL | Status: DC
  Administered 2014-03-06 – 2014-03-08 (×3): 25 ug via ORAL
  Filled 2014-03-05 (×5): qty 1

## 2014-03-05 MED ORDER — TAMSULOSIN HCL 0.4 MG PO CAPS
0.4000 mg | ORAL_CAPSULE | Freq: Every day | ORAL | Status: DC
  Administered 2014-03-05 – 2014-03-07 (×3): 0.4 mg via ORAL
  Filled 2014-03-05 (×5): qty 1

## 2014-03-05 MED ORDER — PROPOFOL 10 MG/ML IV BOLUS
INTRAVENOUS | Status: AC
  Filled 2014-03-05: qty 20

## 2014-03-05 MED ORDER — SODIUM CHLORIDE 0.9 % IR SOLN
Status: DC | PRN
  Administered 2014-03-05: 1000 mL

## 2014-03-05 MED ORDER — LACTATED RINGERS IV SOLN
INTRAVENOUS | Status: DC | PRN
  Administered 2014-03-05 (×2): via INTRAVENOUS

## 2014-03-05 MED ORDER — 0.9 % SODIUM CHLORIDE (POUR BTL) OPTIME
TOPICAL | Status: DC | PRN
  Administered 2014-03-05: 1000 mL

## 2014-03-05 MED ORDER — FENTANYL CITRATE 0.05 MG/ML IJ SOLN
INTRAMUSCULAR | Status: DC | PRN
  Administered 2014-03-05 (×2): 50 ug via INTRAVENOUS

## 2014-03-05 MED ORDER — CEFAZOLIN SODIUM-DEXTROSE 2-3 GM-% IV SOLR
2.0000 g | Freq: Four times a day (QID) | INTRAVENOUS | Status: AC
Start: 2014-03-05 — End: 2014-03-06
  Administered 2014-03-05 – 2014-03-06 (×2): 2 g via INTRAVENOUS
  Filled 2014-03-05 (×2): qty 50

## 2014-03-05 MED ORDER — DEXAMETHASONE SODIUM PHOSPHATE 10 MG/ML IJ SOLN
10.0000 mg | Freq: Once | INTRAMUSCULAR | Status: AC
  Administered 2014-03-06: 10 mg via INTRAVENOUS
  Filled 2014-03-05: qty 1

## 2014-03-05 MED ORDER — MENTHOL 3 MG MT LOZG: 1.0000 | LOZENGE | OROMUCOSAL | Status: DC | PRN

## 2014-03-05 MED ORDER — CHLORHEXIDINE GLUCONATE 4 % EX LIQD: 60.0000 mL | Freq: Once | CUTANEOUS | Status: DC

## 2014-03-05 MED ORDER — ONDANSETRON HCL 4 MG/2ML IJ SOLN: 4.0000 mg | Freq: Four times a day (QID) | INTRAMUSCULAR | Status: DC | PRN

## 2014-03-05 MED ORDER — PHENYLEPHRINE HCL 10 MG/ML IJ SOLN
INTRAMUSCULAR | Status: AC
  Filled 2014-03-05: qty 1

## 2014-03-05 MED ORDER — BUPIVACAINE LIPOSOME 1.3 % IJ SUSP
20.0000 mL | Freq: Once | INTRAMUSCULAR | Status: DC
  Filled 2014-03-05: qty 20

## 2014-03-05 MED ORDER — SODIUM CHLORIDE 0.9 % IJ SOLN
INTRAMUSCULAR | Status: AC
  Filled 2014-03-05: qty 10

## 2014-03-05 MED ORDER — MIDAZOLAM HCL 5 MG/5ML IJ SOLN
INTRAMUSCULAR | Status: DC | PRN
  Administered 2014-03-05 (×2): .25 mg via INTRAVENOUS

## 2014-03-05 MED ORDER — SODIUM CHLORIDE 0.45 % IV SOLN
INTRAVENOUS | Status: DC
  Administered 2014-03-05 – 2014-03-06 (×2): via INTRAVENOUS

## 2014-03-05 MED ORDER — DEXTROSE 5 % IV SOLN
500.0000 mg | Freq: Four times a day (QID) | INTRAVENOUS | Status: DC | PRN
  Administered 2014-03-05: 500 mg via INTRAVENOUS
  Filled 2014-03-05 (×2): qty 5

## 2014-03-05 MED ORDER — FENTANYL CITRATE 0.05 MG/ML IJ SOLN
INTRAMUSCULAR | Status: AC
  Filled 2014-03-05: qty 2

## 2014-03-05 MED ORDER — LACTATED RINGERS IV SOLN
INTRAVENOUS | Status: DC
  Administered 2014-03-05: 1000 mL via INTRAVENOUS

## 2014-03-05 MED ORDER — ONDANSETRON HCL 4 MG PO TABS: 4.0000 mg | ORAL_TABLET | Freq: Four times a day (QID) | ORAL | Status: DC | PRN

## 2014-03-05 MED ORDER — BUPIVACAINE HCL (PF) 0.25 % IJ SOLN
INTRAMUSCULAR | Status: AC
  Filled 2014-03-05: qty 30

## 2014-03-05 MED ORDER — SODIUM CHLORIDE 0.9 % IV SOLN
2000.0000 mg | INTRAVENOUS | Status: DC | PRN
  Administered 2014-03-05: 2000 mg via TOPICAL

## 2014-03-05 MED ORDER — DOCUSATE SODIUM 100 MG PO CAPS
100.0000 mg | ORAL_CAPSULE | Freq: Two times a day (BID) | ORAL | Status: DC
  Administered 2014-03-05 – 2014-03-08 (×6): 100 mg via ORAL

## 2014-03-05 MED ORDER — PROPOFOL INFUSION 10 MG/ML OPTIME
INTRAVENOUS | Status: DC | PRN
  Administered 2014-03-05: 120 ug/kg/min via INTRAVENOUS

## 2014-03-05 MED ORDER — ACETAMINOPHEN 500 MG PO TABS
1000.0000 mg | ORAL_TABLET | Freq: Four times a day (QID) | ORAL | Status: AC
  Administered 2014-03-05 – 2014-03-06 (×4): 1000 mg via ORAL
  Filled 2014-03-05 (×4): qty 2

## 2014-03-05 MED ORDER — KETOROLAC TROMETHAMINE 15 MG/ML IJ SOLN
7.5000 mg | Freq: Four times a day (QID) | INTRAMUSCULAR | Status: AC | PRN
  Administered 2014-03-05: 7.5 mg via INTRAVENOUS

## 2014-03-05 MED ORDER — METHOCARBAMOL 500 MG PO TABS
500.0000 mg | ORAL_TABLET | Freq: Four times a day (QID) | ORAL | Status: DC | PRN
  Administered 2014-03-05 – 2014-03-07 (×3): 500 mg via ORAL
  Filled 2014-03-05 (×3): qty 1

## 2014-03-05 MED ORDER — TRANEXAMIC ACID 100 MG/ML IV SOLN
2000.0000 mg | Freq: Once | INTRAVENOUS | Status: DC
  Filled 2014-03-05: qty 20

## 2014-03-05 MED ORDER — ONDANSETRON HCL 4 MG/2ML IJ SOLN
INTRAMUSCULAR | Status: AC
  Filled 2014-03-05: qty 2

## 2014-03-05 MED ORDER — METOCLOPRAMIDE HCL 10 MG PO TABS: 5.0000 mg | ORAL_TABLET | Freq: Three times a day (TID) | ORAL | Status: DC | PRN

## 2014-03-05 MED ORDER — FLEET ENEMA 7-19 GM/118ML RE ENEM: 1.0000 | ENEMA | Freq: Once | RECTAL | Status: AC | PRN

## 2014-03-05 MED ORDER — DEXAMETHASONE SODIUM PHOSPHATE 10 MG/ML IJ SOLN: 10.0000 mg | Freq: Once | INTRAMUSCULAR | Status: DC

## 2014-03-05 MED ORDER — BUPIVACAINE IN DEXTROSE 0.75-8.25 % IT SOLN
INTRATHECAL | Status: DC | PRN
  Administered 2014-03-05: 1.8 mL via INTRATHECAL

## 2014-03-05 MED ORDER — CEFAZOLIN SODIUM-DEXTROSE 2-3 GM-% IV SOLR
INTRAVENOUS | Status: AC
  Filled 2014-03-05: qty 50

## 2014-03-05 MED ORDER — BUPIVACAINE HCL 0.25 % IJ SOLN
INTRAMUSCULAR | Status: DC | PRN
  Administered 2014-03-05: 20 mL

## 2014-03-05 MED ORDER — MEPERIDINE HCL 50 MG/ML IJ SOLN: 6.2500 mg | INTRAMUSCULAR | Status: DC | PRN

## 2014-03-05 MED ORDER — TRAMADOL HCL 50 MG PO TABS: 50.0000 mg | ORAL_TABLET | Freq: Four times a day (QID) | ORAL | Status: DC | PRN

## 2014-03-05 MED ORDER — CEFAZOLIN SODIUM-DEXTROSE 2-3 GM-% IV SOLR
2.0000 g | INTRAVENOUS | Status: AC
  Administered 2014-03-05: 2 g via INTRAVENOUS

## 2014-03-05 MED ORDER — DIPHENHYDRAMINE HCL 12.5 MG/5ML PO ELIX: 12.5000 mg | ORAL_SOLUTION | ORAL | Status: DC | PRN

## 2014-03-05 MED ORDER — SODIUM CHLORIDE 0.9 % IV SOLN
10.0000 mg | INTRAVENOUS | Status: DC | PRN
  Administered 2014-03-05: 40 ug/min via INTRAVENOUS

## 2014-03-05 MED ORDER — BISACODYL 10 MG RE SUPP: 10.0000 mg | Freq: Every day | RECTAL | Status: DC | PRN

## 2014-03-05 MED ORDER — SODIUM CHLORIDE 0.9 % IJ SOLN
INTRAMUSCULAR | Status: AC
  Filled 2014-03-05: qty 50

## 2014-03-05 MED ORDER — RIVAROXABAN 10 MG PO TABS
10.0000 mg | ORAL_TABLET | Freq: Every day | ORAL | Status: DC
  Administered 2014-03-06 – 2014-03-08 (×3): 10 mg via ORAL
  Filled 2014-03-05 (×5): qty 1

## 2014-03-05 MED ORDER — MORPHINE SULFATE 2 MG/ML IJ SOLN
1.0000 mg | INTRAMUSCULAR | Status: DC | PRN
  Administered 2014-03-05: 1 mg via INTRAVENOUS
  Filled 2014-03-05: qty 1

## 2014-03-05 MED ORDER — PHENOL 1.4 % MT LIQD: 1.0000 | OROMUCOSAL | Status: DC | PRN

## 2014-03-05 MED ORDER — BUPIVACAINE LIPOSOME 1.3 % IJ SUSP
INTRAMUSCULAR | Status: DC | PRN
  Administered 2014-03-05: 20 mL

## 2014-03-05 MED ORDER — DEXAMETHASONE SODIUM PHOSPHATE 10 MG/ML IJ SOLN
INTRAMUSCULAR | Status: DC | PRN
  Administered 2014-03-05: 10 mg via INTRAVENOUS

## 2014-03-05 MED ORDER — PROMETHAZINE HCL 25 MG/ML IJ SOLN: 6.2500 mg | INTRAMUSCULAR | Status: DC | PRN

## 2014-03-05 MED ORDER — ACETAMINOPHEN 10 MG/ML IV SOLN
1000.0000 mg | Freq: Once | INTRAVENOUS | Status: AC
  Administered 2014-03-05: 1000 mg via INTRAVENOUS
  Filled 2014-03-05: qty 100

## 2014-03-05 MED ORDER — MIDAZOLAM HCL 2 MG/2ML IJ SOLN
INTRAMUSCULAR | Status: AC
  Filled 2014-03-05: qty 2

## 2014-03-05 MED ORDER — EPHEDRINE SULFATE 50 MG/ML IJ SOLN
INTRAMUSCULAR | Status: AC
  Filled 2014-03-05: qty 1

## 2014-03-05 MED ORDER — METOCLOPRAMIDE HCL 5 MG/ML IJ SOLN: 5.0000 mg | Freq: Three times a day (TID) | INTRAMUSCULAR | Status: DC | PRN

## 2014-03-05 MED ORDER — ACETAMINOPHEN 650 MG RE SUPP: 650.0000 mg | Freq: Four times a day (QID) | RECTAL | Status: DC | PRN

## 2014-03-05 MED ORDER — KETOROLAC TROMETHAMINE 15 MG/ML IJ SOLN
INTRAMUSCULAR | Status: AC
  Filled 2014-03-05: qty 1

## 2014-03-05 MED ORDER — SODIUM CHLORIDE 0.9 % IV SOLN: INTRAVENOUS | Status: DC

## 2014-03-05 MED ORDER — ONDANSETRON HCL 4 MG/2ML IJ SOLN
INTRAMUSCULAR | Status: DC | PRN
  Administered 2014-03-05: 4 mg via INTRAVENOUS

## 2014-03-05 MED ORDER — SODIUM CHLORIDE 0.9 % IJ SOLN
INTRAMUSCULAR | Status: DC | PRN
  Administered 2014-03-05: 30 mL

## 2014-03-05 MED ORDER — OXYCODONE HCL 5 MG PO TABS
5.0000 mg | ORAL_TABLET | ORAL | Status: DC | PRN
  Administered 2014-03-05 – 2014-03-08 (×12): 10 mg via ORAL
  Filled 2014-03-05 (×12): qty 2

## 2014-03-05 SURGICAL SUPPLY — 61 items
BAG ZIPLOCK 12X15 (MISCELLANEOUS) ×3 IMPLANT
BANDAGE ELASTIC 6 VELCRO ST LF (GAUZE/BANDAGES/DRESSINGS) ×3 IMPLANT
BANDAGE ESMARK 6X9 LF (GAUZE/BANDAGES/DRESSINGS) ×1 IMPLANT
BLADE SAG 18X100X1.27 (BLADE) ×3 IMPLANT
BLADE SAW SGTL 11.0X1.19X90.0M (BLADE) ×3 IMPLANT
BNDG ESMARK 6X9 LF (GAUZE/BANDAGES/DRESSINGS) ×3
BOWL SMART MIX CTS (DISPOSABLE) ×3 IMPLANT
CAP KNEE TOTAL 3 SIGMA ×3 IMPLANT
CEMENT HV SMART SET (Cement) ×6 IMPLANT
CLOSURE WOUND 1/2 X4 (GAUZE/BANDAGES/DRESSINGS) ×1
CUFF TOURN SGL QUICK 34 (TOURNIQUET CUFF) ×2
CUFF TRNQT CYL 34X4X40X1 (TOURNIQUET CUFF) ×1 IMPLANT
DECANTER SPIKE VIAL GLASS SM (MISCELLANEOUS) ×3 IMPLANT
DRAPE EXTREMITY T 121X128X90 (DRAPE) ×3 IMPLANT
DRAPE POUCH INSTRU U-SHP 10X18 (DRAPES) ×3 IMPLANT
DRAPE U-SHAPE 47X51 STRL (DRAPES) ×3 IMPLANT
DRSG ADAPTIC 3X8 NADH LF (GAUZE/BANDAGES/DRESSINGS) ×3 IMPLANT
DRSG PAD ABDOMINAL 8X10 ST (GAUZE/BANDAGES/DRESSINGS) ×3 IMPLANT
DURAPREP 26ML APPLICATOR (WOUND CARE) ×3 IMPLANT
ELECT REM PT RETURN 9FT ADLT (ELECTROSURGICAL) ×3
ELECTRODE REM PT RTRN 9FT ADLT (ELECTROSURGICAL) ×1 IMPLANT
EVACUATOR 1/8 PVC DRAIN (DRAIN) ×3 IMPLANT
FACESHIELD WRAPAROUND (MASK) ×15 IMPLANT
GAUZE SPONGE 4X4 12PLY STRL (GAUZE/BANDAGES/DRESSINGS) ×3 IMPLANT
GLOVE BIO SURGEON STRL SZ7 (GLOVE) ×3 IMPLANT
GLOVE BIO SURGEON STRL SZ7.5 (GLOVE) ×3 IMPLANT
GLOVE BIO SURGEON STRL SZ8 (GLOVE) ×3 IMPLANT
GLOVE BIOGEL PI IND STRL 7.0 (GLOVE) ×1 IMPLANT
GLOVE BIOGEL PI IND STRL 7.5 (GLOVE) ×1 IMPLANT
GLOVE BIOGEL PI IND STRL 8 (GLOVE) ×2 IMPLANT
GLOVE BIOGEL PI INDICATOR 7.0 (GLOVE) ×2
GLOVE BIOGEL PI INDICATOR 7.5 (GLOVE) ×2
GLOVE BIOGEL PI INDICATOR 8 (GLOVE) ×4
GLOVE SURG SS PI 7.5 STRL IVOR (GLOVE) ×3 IMPLANT
GOWN STRL REUS W/TWL LRG LVL3 (GOWN DISPOSABLE) ×3 IMPLANT
GOWN STRL REUS W/TWL XL LVL3 (GOWN DISPOSABLE) ×6 IMPLANT
HANDPIECE INTERPULSE COAX TIP (DISPOSABLE) ×2
IMMOBILIZER KNEE 20 (SOFTGOODS) ×3
IMMOBILIZER KNEE 20 THIGH 36 (SOFTGOODS) ×1 IMPLANT
KIT BASIN OR (CUSTOM PROCEDURE TRAY) ×3 IMPLANT
MANIFOLD NEPTUNE II (INSTRUMENTS) ×3 IMPLANT
NDL SAFETY ECLIPSE 18X1.5 (NEEDLE) ×2 IMPLANT
NEEDLE HYPO 18GX1.5 SHARP (NEEDLE) ×4
NS IRRIG 1000ML POUR BTL (IV SOLUTION) ×3 IMPLANT
PACK TOTAL JOINT (CUSTOM PROCEDURE TRAY) ×3 IMPLANT
PADDING CAST COTTON 6X4 STRL (CAST SUPPLIES) ×3 IMPLANT
POSITIONER SURGICAL ARM (MISCELLANEOUS) ×3 IMPLANT
SET HNDPC FAN SPRY TIP SCT (DISPOSABLE) ×1 IMPLANT
STRIP CLOSURE SKIN 1/2X4 (GAUZE/BANDAGES/DRESSINGS) ×2 IMPLANT
SUCTION FRAZIER 12FR DISP (SUCTIONS) ×3 IMPLANT
SUT MNCRL AB 4-0 PS2 18 (SUTURE) ×3 IMPLANT
SUT VIC AB 2-0 CT1 27 (SUTURE) ×6
SUT VIC AB 2-0 CT1 TAPERPNT 27 (SUTURE) ×3 IMPLANT
SUT VLOC 180 0 24IN GS25 (SUTURE) ×3 IMPLANT
SYR 20CC LL (SYRINGE) ×3 IMPLANT
SYR 50ML LL SCALE MARK (SYRINGE) ×6 IMPLANT
TOWEL OR 17X26 10 PK STRL BLUE (TOWEL DISPOSABLE) ×3 IMPLANT
TOWEL OR NON WOVEN STRL DISP B (DISPOSABLE) ×3 IMPLANT
TRAY FOLEY CATH 16FRSI W/METER (SET/KITS/TRAYS/PACK) ×3 IMPLANT
WATER STERILE IRR 1500ML POUR (IV SOLUTION) ×3 IMPLANT
WRAP KNEE MAXI GEL POST OP (GAUZE/BANDAGES/DRESSINGS) ×3 IMPLANT

## 2014-03-05 NOTE — Interval H&P Note (Signed)
History and Physical Interval Note:  03/05/2014 12:34 PM  Lawrence Wilson  has presented today for surgery, with the diagnosis of right knee osteoarthritis  The various methods of treatment have been discussed with the patient and family. After consideration of risks, benefits and other options for treatment, the patient has consented to  Procedure(s): RIGHT TOTAL KNEE ARTHROPLASTY (Right) as a surgical intervention .  The patient's history has been reviewed, patient examined, no change in status, stable for surgery.  I have reviewed the patient's chart and labs.  Questions were answered to the patient's satisfaction.     Gearlean Alf

## 2014-03-05 NOTE — H&P (View-Only) (Signed)
Lawrence Wilson DOB: 10-18-1929 Married / Language: Lawrence Wilson / Race: White Male Date of Admission:  03/05/2014 CC:  Right Knee Pain History of Present Illness The patient is a 81 year old male who comes in  for a preoperative History and Physical. The patient is scheduled for a right total knee arthroplasty to be performed by Dr. Dione Wilson. [name], MD at [facility] on 03-05-2014. The patient is a 81 year old male who presents with knee complaints. The patient was seen  for a second opinion (for right greater than left knee pain). The patient reports left knee and right knee symptoms including: pain, weakness, stiffness and soreness which began year(s) ago without any known injury. The patient has the current diagnosis of knee osteoarthritis. Prior to being seen today the patient was previously evaluated by a colleague (Dr. Onnie Wilson). Previous work-up for this problem has included knee x-rays. Past treatment for this problem has included intra-articular injection of corticosteroids. The patient was last seen in clinic 4 month(s) ago. He has had more trouble with the knees in the past 2 years, right greater than left. He feels that the right knee is weaker than the left. He denies groin pain. No injuries to the knees. He has had a couple cortisone injections in the knee before, last in May, which did not help at all. He typically only has pain with weightbearing, not at rest. He is to the point now that he would like to move forward to TKA. They have been treated conservatively in the past for the above stated problem and despite conservative measures, they continue to have progressive pain and severe functional limitations and dysfunction. They have failed non-operative management including home exercise, medications, and injections. It is felt that they would benefit from undergoing total joint replacement. Risks and benefits of the procedure have been discussed with the patient and they elect to proceed  with surgery. There are no active contraindications to surgery such as ongoing infection or rapidly progressive neurological disease.  Problem List/Past Medical  Osteoarthritis of right knee (M17.9) Primary osteoarthritis of both knees (M17.0) Chronic lymphocytic leukemia (C91.10) Skin Cancer High blood pressure Peripheral Neuropathy Shingles Cataract Bilateral Sleep Apnea uses CPAP Colon Cancer Measles Mumps Benign Neoplasm of Colon Chronic Rhinitis Contact Dermatitis Enlarged Prostate Herpes Zoster Hypothyroidism Malignant Neoplasm of the Skin Impaired Memory Myopia Both Eyes Chronic kidney disease Stage III  Allergies  No Known Drug Allergies  Family History ( Cancer Mother, Sister. Hypertension Mother. Congestive Heart Failure Father. Heart Disease Brother, Father.  Social History Exercise Exercises weekly; does other and gym / weights Living situation live with spouse Marital status married Current drinker 10/20/2013: Currently drinks beer only occasionally per week Current work status retired Tobacco use Never smoker. 10/20/2013 No history of drug/alcohol rehab Not under pain contract Number of flights of stairs before winded greater than 5 Advance Directives Living Will, [place]. He is a resident of [place].  Medication History  Levothyroxine Sodium (25MCG Tablet, Oral) Active. Lisinopril-Hydrochlorothiazide (20-12.5MG  Tablet, Oral) Active. Tamsulosin HCl (0.4MG  Capsule, Oral) Active. Fish Oil Active. Multivitamin (Oral) Active. Aleve (220MG  Tablet, Oral) Active.  Past Surgical History Inguinal Hernia Repair Date: 1980. open: left Tonsillectomy Colon Polyp Removal - Open Colectomy Date: 2010. partial Colon Polyp Removal - Colonoscopy Adnoidectomy   Review of Systems  General Not Present- Chills, Fatigue, Fever, Memory Loss, Night Sweats, Weight Gain and Weight  Loss. Skin Not Present- Eczema, Hives, Itching, Lesions and Rash. HEENT  Not Present- Dentures, Double Vision, Headache, Hearing Loss, Tinnitus and Visual Loss. Respiratory Not Present- Allergies, Chronic Cough, Coughing up blood, Shortness of breath at rest and Shortness of breath with exertion. Cardiovascular Not Present- Chest Pain, Difficulty Breathing Lying Down, Murmur, Palpitations, Racing/skipping heartbeats and Swelling. Gastrointestinal Present- Heartburn (slight). Not Present- Abdominal Pain, Bloody Stool, Constipation, Diarrhea, Difficulty Swallowing, Jaundice, Loss of appetitie, Nausea and Vomiting. Male Genitourinary Present- Urinary frequency, Urinary Retention and Urinating at Night. Not Present- Blood in Urine, Discharge, Flank Pain, Incontinence, Painful Urination, Urgency and Weak urinary stream. Musculoskeletal Present- Joint Pain and Morning Stiffness. Not Present- Back Pain, Joint Swelling, Muscle Pain, Muscle Weakness and Spasms. Neurological Not Present- Blackout spells, Difficulty with balance, Dizziness, Paralysis, Tremor and Weakness. Psychiatric Not Present- Insomnia.   Vitals Weight: 190 lb Height: 69in Weight was reported by patient. Height was reported by patient. Body Surface Area: 2.02 m Body Mass Index: 28.06 kg/m  BP: 142/72 (Sitting, Left Arm, Standard)   Physical Exam  General Mental Status -Alert, cooperative and good historian. General Appearance-pleasant, Not in acute distress. Orientation-Oriented X3. Build & Nutrition-Well nourished and Well developed.  Head and Neck Head-normocephalic, atraumatic . Neck Global Assessment - supple, no bruit auscultated on the right, no bruit auscultated on the left.  Eye Pupil - Bilateral-Regular and Round. Motion - Bilateral-EOMI.  Chest and Lung Exam Auscultation Breath sounds - clear at anterior chest wall and clear at posterior chest wall. Adventitious sounds - No Adventitious  sounds.  Cardiovascular Auscultation Rhythm - Regular rate and rhythm. Heart Sounds - S1 WNL and S2 WNL. Murmurs & Other Heart Sounds - Auscultation of the heart reveals - No Murmurs.  Abdomen Palpation/Percussion Tenderness - Abdomen is non-tender to palpation. Rigidity (guarding) - Abdomen is soft. Auscultation Auscultation of the abdomen reveals - Bowel sounds normal.  Male Genitourinary Note: Not done, not pertinent to present illness   Musculoskeletal Note: He is a well developed male. He is alert and oriented. No apparent distress. Both hips show a normal range of motion. No discomfort. The right knee shows no effusion. Range is about 5-120 with marked crepitus on range of motion. There is tenderness medial greater than lateral. No instability noted.  The left knee with no effusion. Range about 5-120. Moderate crepitus on range of motion. There is tenderness medial greater than lateral. No instability. Pulse, sensation and motor are intact in both lower extremities.  RADIOGRAPHS: AP both knees and lateral show severe advanced bone on bone changes in both knees with tibial subluxation of both. Right is worse than left knee.   Assessment & Plan  Osteoarthritis of right knee (M17.9) Note:Plan is for a Right Total Knee Replacement by Dr. Wynelle Link.  Plan is to go home.  PCP - Dr. Suzy Bouchard - Patient has been seen preoperatively and felt to be stable for surgery. "Patient has no active cardiac symptoms and maintains independent functional status. No acute changes on EKG in office today." "Patient advised he may need to hold his lisinopril-HCTZ the day of surgery, but will defer perioperative medication management to operative team."  Hem/Onc - Dr. Jacqulyn Ducking - HGB 12.9 noted on 01/15/2014  Topical TXA - Colon Cancer  Signed electronically by Joelene Millin, III PA-C

## 2014-03-05 NOTE — Op Note (Signed)
Pre-operative diagnosis- Osteoarthritis  Right knee(s)  Post-operative diagnosis- Osteoarthritis Right knee(s)  Procedure-  Right  Total Knee Arthroplasty  Surgeon- Lawrence Wilson. Maddalena Linarez, MD  Assistant- Arlee Muslim, PA-C   Anesthesia-  Spinal  EBL-* No blood loss amount entered *   Drains Hemovac  Tourniquet time-  Total Tourniquet Time Documented: Thigh (Right) - 40 minutes Total: Thigh (Right) - 40 minutes     Complications- None  Condition-PACU - hemodynamically stable.   Brief Clinical Note  Lawrence Wilson is a 81 y.o. year old male with end stage OA of his right knee with progressively worsening pain and dysfunction. He has constant pain, with activity and at rest and significant functional deficits with difficulties even with ADLs. He has had extensive non-op management including analgesics, injections of cortisone, and home exercise program, but remains in significant pain with significant dysfunction. Radiographs show tricompartmental bone on bone changes with tibial subluxation. He presents now for right Total Knee Arthroplasty.    Procedure in detail---   The patient is brought into the operating room and positioned supine on the operating table. After successful administration of  Spinal,   a tourniquet is placed high on the  Right thigh(s) and the lower extremity is prepped and draped in the usual sterile fashion. Time out is performed by the operating team and then the  Right lower extremity is wrapped in Esmarch, knee flexed and the tourniquet inflated to 300 mmHg.       A midline incision is made with a ten blade through the subcutaneous tissue to the level of the extensor mechanism. A fresh blade is used to make a medial parapatellar arthrotomy. Soft tissue over the proximal medial tibia is subperiosteally elevated to the joint line with a knife and into the semimembranosus bursa with a Cobb elevator. Soft tissue over the proximal lateral tibia is elevated with attention being  paid to avoiding the patellar tendon on the tibial tubercle. The patella is everted, knee flexed 90 degrees and the ACL and PCL are removed. Findings are bone on bone all 3 compartments with massive global osteophytes        The drill is used to create a starting hole in the distal femur and the canal is thoroughly irrigated with sterile saline to remove the fatty contents. The 5 degree Right  valgus alignment guide is placed into the femoral canal and the distal femoral cutting block is pinned to remove 10 mm off the distal femur. Resection is made with an oscillating saw.      The tibia is subluxed forward and the menisci are removed. The extramedullary alignment guide is placed referencing proximally at the medial aspect of the tibial tubercle and distally along the second metatarsal axis and tibial crest. The block is pinned to remove 45mm off the more deficient medial  side. Resection is made with an oscillating saw. Size 4is the most appropriate size for the tibia and the proximal tibia is prepared with the modular drill and keel punch for that size.      The femoral sizing guide is placed and size 4 is most appropriate. Rotation is marked off the epicondylar axis and confirmed by creating a rectangular flexion gap at 90 degrees. The size 4 cutting block is pinned in this rotation and the anterior, posterior and chamfer cuts are made with the oscillating saw. The intercondylar block is then placed and that cut is made.      Trial size 4 tibial component, trial size  4 posterior stabilized femur and a 15  mm posterior stabilized rotating platform insert trial is placed. Full extension is achieved with excellent varus/valgus and anterior/posterior balance throughout full range of motion. The patella is everted and thickness measured to be 27  mm. Free hand resection is taken to 15 mm, a 38 template is placed, lug holes are drilled, trial patella is placed, and it tracks normally. Osteophytes are removed off  the posterior femur with the trial in place. All trials are removed and the cut bone surfaces prepared with pulsatile lavage. Cement is mixed and once ready for implantation, the size 4 tibial implant, size  4 posterior stabilized femoral component, and the size 38 patella are cemented in place and the patella is held with the clamp. The trial insert is placed and the knee held in full extension. The Exparel (20 ml mixed with 30 ml saline) and .25% Bupivicaine, are injected into the extensor mechanism, posterior capsule, medial and lateral gutters and subcutaneous tissues.  All extruded cement is removed and once the cement is hard the permanent 15 mm posterior stabilized rotating platform insert is placed into the tibial tray.      The wound is copiously irrigated with saline solution and the extensor mechanism closed over a hemovac drain with #1 V-loc suture. The tourniquet is released for a total tourniquet time of 40  minutes. Flexion against gravity is 140 degrees and the patella tracks normally. Subcutaneous tissue is closed with 2.0 vicryl and subcuticular with running 4.0 Monocryl. The incision is cleaned and dried and steri-strips and a bulky sterile dressing are applied. The limb is placed into a knee immobilizer and the patient is awakened and transported to recovery in stable condition.      Please note that a surgical assistant was a medical necessity for this procedure in order to perform it in a safe and expeditious manner. Surgical assistant was necessary to retract the ligaments and vital neurovascular structures to prevent injury to them and also necessary for proper positioning of the limb to allow for anatomic placement of the prosthesis.   Lawrence Wilson Lawrence Zuelke, MD    03/05/2014, 2:40 PM

## 2014-03-05 NOTE — Transfer of Care (Signed)
Immediate Anesthesia Transfer of Care Note  Patient: Lawrence Wilson  Procedure(s) Performed: Procedure(s): RIGHT TOTAL KNEE ARTHROPLASTY (Right)  Patient Location: PACU  Anesthesia Type:Spinal  Level of Consciousness: awake, alert , oriented and patient cooperative  Airway & Oxygen Therapy: Patient Spontanous Breathing and Patient connected to face mask oxygen  Post-op Assessment: Report given to PACU RN and Post -op Vital signs reviewed and stable  Post vital signs: Reviewed and stable  Complications: No apparent anesthesia complications

## 2014-03-05 NOTE — Anesthesia Postprocedure Evaluation (Signed)
  Anesthesia Post-op Note  Patient: Lawrence Wilson  Procedure(s) Performed: Procedure(s) (LRB): RIGHT TOTAL KNEE ARTHROPLASTY (Right)  Patient Location: PACU  Anesthesia Type: Spinal  Level of Consciousness: awake and alert   Airway and Oxygen Therapy: Patient Spontanous Breathing  Post-op Pain: mild  Post-op Assessment: Post-op Vital signs reviewed, Patient's Cardiovascular Status Stable, Respiratory Function Stable, Patent Airway and No signs of Nausea or vomiting  Last Vitals:  Filed Vitals:   03/05/14 1630  BP: 136/70  Pulse: 58  Temp: 36.9 C  Resp: 14    Post-op Vital Signs: stable   Complications: No apparent anesthesia complications

## 2014-03-05 NOTE — Plan of Care (Signed)
Problem: Consults Goal: Diagnosis- Total Joint Replacement Right total knee     

## 2014-03-05 NOTE — Anesthesia Preprocedure Evaluation (Addendum)
Anesthesia Evaluation  Patient identified by MRN, date of birth, ID band Patient awake    Reviewed: Allergy & Precautions, NPO status , Patient's Chart, lab work & pertinent test results  Airway Mallampati: II  TM Distance: >3 FB Neck ROM: Full    Dental no notable dental hx.    Pulmonary sleep apnea and Continuous Positive Airway Pressure Ventilation ,  breath sounds clear to auscultation  Pulmonary exam normal       Cardiovascular hypertension, Pt. on medications negative cardio ROS  Rhythm:Regular Rate:Normal     Neuro/Psych peripheral neuropathy  negative neurological ROS  negative psych ROS   GI/Hepatic negative GI ROS, Neg liver ROS,   Endo/Other  negative endocrine ROS  Renal/GU negative Renal ROS  negative genitourinary   Musculoskeletal negative musculoskeletal ROS (+)   Abdominal   Peds negative pediatric ROS (+)  Hematology CLL   Anesthesia Other Findings   Reproductive/Obstetrics negative OB ROS                            Anesthesia Physical Anesthesia Plan  ASA: II  Anesthesia Plan: Spinal   Post-op Pain Management:    Induction: Intravenous  Airway Management Planned: Simple Face Mask  Additional Equipment:   Intra-op Plan:   Post-operative Plan:   Informed Consent: I have reviewed the patients History and Physical, chart, labs and discussed the procedure including the risks, benefits and alternatives for the proposed anesthesia with the patient or authorized representative who has indicated his/her understanding and acceptance.   Dental advisory given  Plan Discussed with: CRNA  Anesthesia Plan Comments:         Anesthesia Quick Evaluation

## 2014-03-05 NOTE — Anesthesia Procedure Notes (Signed)
Spinal  Start time: 03/05/2014 1:28 PM Staffing Resident/CRNA: GRAY, JOANNE E Performed by: resident/CRNA  Preanesthetic Checklist Completed: patient identified, site marked, surgical consent, pre-op evaluation, timeout performed, IV checked, risks and benefits discussed and monitors and equipment checked Spinal Block Patient position: sitting Prep: Betadine Patient monitoring: heart rate, continuous pulse ox and blood pressure Approach: right paramedian Location: L3-4 Injection technique: single-shot Needle Needle type: Spinocan  Needle gauge: 22 G Needle length: 9 cm Additional Notes Kit expiration date checked.  Tolerated well, neg. Heme, paresthesia.   

## 2014-03-06 ENCOUNTER — Encounter (HOSPITAL_COMMUNITY): Payer: Self-pay | Admitting: Orthopedic Surgery

## 2014-03-06 LAB — CBC
HCT: 36.3 % — ABNORMAL LOW (ref 39.0–52.0)
Hemoglobin: 11.7 g/dL — ABNORMAL LOW (ref 13.0–17.0)
MCH: 30.3 pg (ref 26.0–34.0)
MCHC: 32.2 g/dL (ref 30.0–36.0)
MCV: 94 fL (ref 78.0–100.0)
Platelets: 187 10*3/uL (ref 150–400)
RBC: 3.86 MIL/uL — ABNORMAL LOW (ref 4.22–5.81)
RDW: 13.9 % (ref 11.5–15.5)
WBC: 20 10*3/uL — AB (ref 4.0–10.5)

## 2014-03-06 LAB — BASIC METABOLIC PANEL
Anion gap: 6 (ref 5–15)
BUN: 27 mg/dL — ABNORMAL HIGH (ref 6–23)
CO2: 26 mmol/L (ref 19–32)
Calcium: 8.1 mg/dL — ABNORMAL LOW (ref 8.4–10.5)
Chloride: 105 mmol/L (ref 96–112)
Creatinine, Ser: 1.34 mg/dL (ref 0.50–1.35)
GFR calc Af Amer: 55 mL/min — ABNORMAL LOW (ref >90)
GFR calc non Af Amer: 48 mL/min — ABNORMAL LOW (ref >90)
Glucose, Bld: 154 mg/dL — ABNORMAL HIGH (ref 70–99)
POTASSIUM: 5 mmol/L (ref 3.5–5.1)
SODIUM: 137 mmol/L (ref 135–145)

## 2014-03-06 MED ORDER — OXYCODONE HCL 5 MG PO TABS: 5.0000 mg | ORAL_TABLET | ORAL | Status: DC | PRN

## 2014-03-06 MED ORDER — RIVAROXABAN 10 MG PO TABS: 10.0000 mg | ORAL_TABLET | Freq: Every day | ORAL | Status: DC

## 2014-03-06 MED ORDER — METHOCARBAMOL 500 MG PO TABS: 500.0000 mg | ORAL_TABLET | Freq: Four times a day (QID) | ORAL | Status: DC | PRN

## 2014-03-06 MED ORDER — TRAMADOL HCL 50 MG PO TABS: 50.0000 mg | ORAL_TABLET | Freq: Four times a day (QID) | ORAL | Status: DC | PRN

## 2014-03-06 NOTE — Progress Notes (Signed)
RN helped patient with home CPAP. RT will call [employer] to come and check machine in the AM. Cords are not frayed or torn. RT will continue to assess and monitor as needed.

## 2014-03-06 NOTE — Evaluation (Signed)
Physical Therapy Evaluation Patient Details Name: Lawrence Wilson MRN: [id number] DOB: July 21, 1929 Today's Date: 03/06/2014   History of Present Illness  s/p R TKA  Clinical Impression  Pt will benefit from PT to address deficits below; plan is for home with wife support and HHPT (at [facility]); will continue to follow    Follow Up Recommendations Home health PT    Equipment Recommendations  None recommended by PT    Recommendations for Other Services       Precautions / Restrictions Precautions Precautions: Knee Precaution Comments: I SLR today, KI not utilized Required Braces or Orthoses: Knee Immobilizer - Right Knee Immobilizer - Right: Discontinue once straight leg raise with < 10 degree lag Restrictions Weight Bearing Restrictions: No Other Position/Activity Restrictions: WBAT      Mobility  Bed Mobility                  Transfers Overall transfer level: Needs assistance Equipment used: Rolling walker (2 wheeled) Transfers: Sit to/from Stand Sit to Stand: Min guard         General transfer comment: min cues for safety. tends to move quickly.  Ambulation/Gait                Stairs            Wheelchair Mobility    Modified Rankin (Stroke Patients Only)       Balance                                             Pertinent Vitals/Pain Pain Assessment: 0-10 Pain Score: 5  Pain Location: R knee Pain Descriptors / Indicators: Aching Pain Intervention(s): Repositioned;Ice applied    Home Living Family/patient expects to be discharged to:: Private residence Living Arrangements: Spouse/significant other Available Help at Discharge: Family Type of Home: House       Home Layout: One level Home Equipment: Environmental consultant - 2 wheels;Walker - 4 wheels;Bedside commode;Adaptive equipment Additional Comments: resides at [employer]; handicapped accessible/raised ht toilets, etc    Prior Function Level of Independence:  Independent               Hand Dominance        Extremity/Trunk Assessment   Upper Extremity Assessment: Overall WFL for tasks assessed                     Communication   Communication: No difficulties  Cognition Arousal/Alertness: Awake/alert Behavior During Therapy: WFL for tasks assessed/performed Overall Cognitive Status: Within Functional Limits for tasks assessed                      General Comments      Exercises Total Joint Exercises Ankle Circles/Pumps: AROM;Both;10 reps Quad Sets: 10 reps;AROM;Strengthening;Both Heel Slides: AROM;10 reps;AAROM;Right Hip ABduction/ADduction: AROM;AAROM;Strengthening;10 reps;Right Straight Leg Raises: AROM;Both;10 reps      Assessment/Plan    PT Assessment Patient needs continued PT services  PT Diagnosis Difficulty walking;Generalized weakness   PT Problem List Decreased strength;Decreased range of motion;Decreased activity tolerance;Decreased mobility;Decreased knowledge of use of DME  PT Treatment Interventions DME instruction;Gait training;Functional mobility training;Therapeutic activities;Therapeutic exercise;Patient/family education   PT Goals (Current goals can be found in the Care Plan section) Acute Rehab PT Goals Patient Stated Goal: home when ready PT Goal Formulation: With patient Time For Goal Achievement: 03/09/14 Potential to Achieve Goals: Good  Frequency 7X/week   Barriers to discharge        Co-evaluation               End of Session Equipment Utilized During Treatment: Gait belt Activity Tolerance: Patient tolerated treatment well Patient left: in chair;with call bell/phone within reach;with family/visitor present Nurse Communication: Mobility status         Time: 1624-4695 PT Time Calculation (min) (ACUTE ONLY): 19 min   Charges:   PT Evaluation $Initial PT Evaluation Tier I: 1 Procedure PT Treatments $Therapeutic Exercise: 8-22 mins   PT G Codes:         Miosha Behe 2014/03/28, 1:35 PM

## 2014-03-06 NOTE — Progress Notes (Signed)
Arrived to bedside and found pt already wearing his home cpap with nasal mask.  Pt is tolerating well, stating he is doing fine and doesn't need anything.  No frays on cord or obvious defects noted.  Machine plugged into red outlet.

## 2014-03-06 NOTE — Progress Notes (Signed)
Utilization review completed.  

## 2014-03-06 NOTE — Progress Notes (Signed)
Physical Therapy Treatment Patient Details Name: Lawrence Wilson MRN: [id number] DOB: 1929/07/01 Today's Date: 03/06/2014    History of Present Illness s/p R TKA    PT Comments    Pt progressing well; family concerned about falls at home, discussed hiring incr assist initally (pt wife amb with RW) for incr supervision and safety; will continue to follow;   Follow Up Recommendations  Home health PT     Equipment Recommendations  None recommended by PT    Recommendations for Other Services       Precautions / Restrictions Precautions Precautions: Knee Precaution Comments: I SLR today, KI not utilized Required Braces or Orthoses: Knee Immobilizer - Right Knee Immobilizer - Right: Discontinue once straight leg raise with < 10 degree lag Restrictions Weight Bearing Restrictions: No Other Position/Activity Restrictions: WBAT    Mobility  Bed Mobility Overal bed mobility: Needs Assistance Bed Mobility: Sit to Supine       Sit to supine: Supervision   General bed mobility comments: cues for technique  Transfers Overall transfer level: Needs assistance Equipment used: Rolling walker (2 wheeled) Transfers: Sit to/from Stand Sit to Stand: Min guard         General transfer comment: cues for hand placement and LE position  Ambulation/Gait Ambulation/Gait assistance: Min guard;Supervision Ambulation Distance (Feet): 170 Feet Assistive device: Rolling walker (2 wheeled) Gait Pattern/deviations: Step-to pattern;Antalgic     General Gait Details: cues for RW safety, especially with turns   Stairs            Wheelchair Mobility    Modified Rankin (Stroke Patients Only)       Balance                                    Cognition Arousal/Alertness: Awake/alert Behavior During Therapy: WFL for tasks assessed/performed Overall Cognitive Status: Within Functional Limits for tasks assessed                      Exercises Total Joint  Exercises Ankle Circles/Pumps: AROM;Both;10 reps Quad Sets: 10 reps;AROM;Strengthening;Both Heel Slides: AROM;10 reps;AAROM;Right Hip ABduction/ADduction: AROM;AAROM;Strengthening;10 reps;Right Straight Leg Raises: AROM;Both;10 reps Knee flexion grossly -5 to 75* AAROM    General Comments        Pertinent Vitals/Pain Pain Assessment: 0-10 Pain Score: 3  Pain Location: R knee Pain Descriptors / Indicators: Sore Pain Intervention(s): Limited activity within patient's tolerance;Monitored during session;Premedicated before session    Home Living Family/patient expects to be discharged to:: Private residence Living Arrangements: Spouse/significant other Available Help at Discharge: Family Type of Home: House     Home Layout: One level Home Equipment: Environmental consultant - 2 wheels;Walker - 4 wheels;Bedside commode;Adaptive equipment Additional Comments: resides at [employer]; handicapped accessible/raised ht toilets, etc    Prior Function Level of Independence: Independent          PT Goals (current goals can now be found in the care plan section) Acute Rehab PT Goals Patient Stated Goal: home when ready PT Goal Formulation: With patient Time For Goal Achievement: 03/09/14 Potential to Achieve Goals: Good Progress towards PT goals: Progressing toward goals    Frequency  7X/week    PT Plan Current plan remains appropriate    Co-evaluation             End of Session Equipment Utilized During Treatment: Gait belt Activity Tolerance: Patient tolerated treatment well Patient left: in bed;with call bell/phone  within reach;with family/visitor present     Time: 3254-9826 PT Time Calculation (min) (ACUTE ONLY): 18 min  Charges:  $Gait Training: 8-22 mins $Therapeutic Exercise: 8-22 mins                    G Codes:      Abdul Beirne 03-25-14, 3:52 PM

## 2014-03-06 NOTE — Discharge Summary (Signed)
Physician Discharge Summary   Patient ID: Lawrence Wilson MRN: [id number] DOB/AGE: April 20, 1929 81 y.o.  Admit date: 03/05/2014 Discharge date: 03/08/2014  Primary Diagnosis:  Osteoarthritis Right knee(s) Admission Diagnoses:  Past Medical History  Diagnosis Date  . Hypertension   . Hypothyroid   . Shingles   . CLL (chronic lymphocytic leukemia)   . Neuromuscular disorder     peripheral neuropathy   . Sleep apnea     cpap- 9   . Urinary frequency   . Arthritis    Discharge Diagnoses:   Principal Problem:   OA (osteoarthritis) of knee  Estimated body mass index is 27.31 kg/(m^2) as calculated from the following:   Height as of this encounter: 5' 9"  (1.753 m).   Weight as of this encounter: 83.915 kg (185 lb).  Procedure:  Procedure(s) (LRB): RIGHT TOTAL KNEE ARTHROPLASTY (Right)   Consults: None  HPI: Lawrence Wilson is a 81 y.o. year old male with end stage OA of his right knee with progressively worsening pain and dysfunction. He has constant pain, with activity and at rest and significant functional deficits with difficulties even with ADLs. He has had extensive non-op management including analgesics, injections of cortisone, and home exercise program, but remains in significant pain with significant dysfunction. Radiographs show tricompartmental bone on bone changes with tibial subluxation. He presents now for right Total Knee Arthroplasty. Laboratory Data: Admission on 03/05/2014, Discharged on 03/08/2014  Component Date Value Ref Range Status  . ABO/RH(D) 03/05/2014 B POS   Final  . Antibody Screen 03/05/2014 NEG   Final  . Sample Expiration 03/05/2014 03/08/2014   Final  . ABO/RH(D) 03/05/2014 B POS   Final  . WBC 03/06/2014 20.0* 4.0 - 10.5 K/uL Final  . RBC 03/06/2014 3.86* 4.22 - 5.81 MIL/uL Final  . Hemoglobin 03/06/2014 11.7* 13.0 - 17.0 g/dL Final  . HCT 03/06/2014 36.3* 39.0 - 52.0 % Final  . MCV 03/06/2014 94.0  78.0 - 100.0 fL Final  . MCH 03/06/2014 30.3  26.0 -  34.0 pg Final  . MCHC 03/06/2014 32.2  30.0 - 36.0 g/dL Final  . RDW 03/06/2014 13.9  11.5 - 15.5 % Final  . Platelets 03/06/2014 187  150 - 400 K/uL Final  . Sodium 03/06/2014 137  135 - 145 mmol/L Final  . Potassium 03/06/2014 5.0  3.5 - 5.1 mmol/L Final  . Chloride 03/06/2014 105  96 - 112 mmol/L Final  . CO2 03/06/2014 26  19 - 32 mmol/L Final  . Glucose, Bld 03/06/2014 154* 70 - 99 mg/dL Final  . BUN 03/06/2014 27* 6 - 23 mg/dL Final  . Creatinine, Ser 03/06/2014 1.34  0.50 - 1.35 mg/dL Final  . Calcium 03/06/2014 8.1* 8.4 - 10.5 mg/dL Final  . GFR calc non Af Amer 03/06/2014 48* >90 mL/min Final  . GFR calc Af Amer 03/06/2014 55* >90 mL/min Final   Comment: (NOTE) The eGFR has been calculated using the CKD EPI equation. This calculation has not been validated in all clinical situations. eGFR's persistently <90 mL/min signify possible Chronic Kidney Disease.   . Anion gap 03/06/2014 6  5 - 15 Final  . WBC 03/07/2014 21.1* 4.0 - 10.5 K/uL Final  . RBC 03/07/2014 3.51* 4.22 - 5.81 MIL/uL Final  . Hemoglobin 03/07/2014 10.7* 13.0 - 17.0 g/dL Final  . HCT 03/07/2014 32.5* 39.0 - 52.0 % Final  . MCV 03/07/2014 92.6  78.0 - 100.0 fL Final  . MCH 03/07/2014 30.5  26.0 - 34.0 pg Final  .  MCHC 03/07/2014 32.9  30.0 - 36.0 g/dL Final  . RDW 03/07/2014 14.1  11.5 - 15.5 % Final  . Platelets 03/07/2014 181  150 - 400 K/uL Final  . Sodium 03/07/2014 139  135 - 145 mmol/L Final  . Potassium 03/07/2014 4.1  3.5 - 5.1 mmol/L Final   DELTA CHECK NOTED  . Chloride 03/07/2014 106  96 - 112 mmol/L Final  . CO2 03/07/2014 25  19 - 32 mmol/L Final  . Glucose, Bld 03/07/2014 146* 70 - 99 mg/dL Final  . BUN 03/07/2014 24* 6 - 23 mg/dL Final  . Creatinine, Ser 03/07/2014 1.14  0.50 - 1.35 mg/dL Final  . Calcium 03/07/2014 8.3* 8.4 - 10.5 mg/dL Final  . GFR calc non Af Amer 03/07/2014 58* >90 mL/min Final  . GFR calc Af Amer 03/07/2014 67* >90 mL/min Final   Comment: (NOTE) The eGFR has been  calculated using the CKD EPI equation. This calculation has not been validated in all clinical situations. eGFR's persistently <90 mL/min signify possible Chronic Kidney Disease.   . Anion gap 03/07/2014 8  5 - 15 Final  . WBC 03/08/2014 15.9* 4.0 - 10.5 K/uL Final  . RBC 03/08/2014 3.13* 4.22 - 5.81 MIL/uL Final  . Hemoglobin 03/08/2014 9.6* 13.0 - 17.0 g/dL Final  . HCT 03/08/2014 29.2* 39.0 - 52.0 % Final  . MCV 03/08/2014 93.3  78.0 - 100.0 fL Final  . MCH 03/08/2014 30.7  26.0 - 34.0 pg Final  . MCHC 03/08/2014 32.9  30.0 - 36.0 g/dL Final  . RDW 03/08/2014 14.4  11.5 - 15.5 % Final  . Platelets 03/08/2014 166  150 - 400 K/uL Final  Hospital Outpatient Visit on 02/26/2014  Component Date Value Ref Range Status  . aPTT 02/26/2014 27  24 - 37 seconds Final  . WBC 02/26/2014 16.7* 4.0 - 10.5 K/uL Final  . RBC 02/26/2014 4.54  4.22 - 5.81 MIL/uL Final  . Hemoglobin 02/26/2014 14.0  13.0 - 17.0 g/dL Final  . HCT 02/26/2014 42.5  39.0 - 52.0 % Final  . MCV 02/26/2014 93.6  78.0 - 100.0 fL Final  . MCH 02/26/2014 30.8  26.0 - 34.0 pg Final  . MCHC 02/26/2014 32.9  30.0 - 36.0 g/dL Final  . RDW 02/26/2014 14.2  11.5 - 15.5 % Final  . Platelets 02/26/2014 195  150 - 400 K/uL Final  . Sodium 02/26/2014 143  135 - 145 mmol/L Final   Please note change in reference range.  . Potassium 02/26/2014 4.8  3.5 - 5.1 mmol/L Final   Please note change in reference range.  . Chloride 02/26/2014 108  96 - 112 mEq/L Final  . CO2 02/26/2014 27  19 - 32 mmol/L Final  . Glucose, Bld 02/26/2014 159* 70 - 99 mg/dL Final  . BUN 02/26/2014 30* 6 - 23 mg/dL Final  . Creatinine, Ser 02/26/2014 1.23  0.50 - 1.35 mg/dL Final  . Calcium 02/26/2014 8.9  8.4 - 10.5 mg/dL Final  . Total Protein 02/26/2014 6.5  6.0 - 8.3 g/dL Final  . Albumin 02/26/2014 4.2  3.5 - 5.2 g/dL Final  . AST 02/26/2014 35  0 - 37 U/L Final  . ALT 02/26/2014 46  0 - 53 U/L Final  . Alkaline Phosphatase 02/26/2014 59  39 - 117 U/L  Final  . Total Bilirubin 02/26/2014 0.6  0.3 - 1.2 mg/dL Final  . GFR calc non Af Amer 02/26/2014 53* >90 mL/min Final  . GFR calc Af  Amer 02/26/2014 61* >90 mL/min Final   Comment: (NOTE) The eGFR has been calculated using the CKD EPI equation. This calculation has not been validated in all clinical situations. eGFR's persistently <90 mL/min signify possible Chronic Kidney Disease.   . Anion gap 02/26/2014 8  5 - 15 Final  . Prothrombin Time 02/26/2014 13.4  11.6 - 15.2 seconds Final  . INR 02/26/2014 1.01  0.00 - 1.49 Final  . Color, Urine 02/26/2014 YELLOW  YELLOW Final  . APPearance 02/26/2014 CLEAR  CLEAR Final  . Specific Gravity, Urine 02/26/2014 1.024  1.005 - 1.030 Final  . pH 02/26/2014 6.5  5.0 - 8.0 Final  . Glucose, UA 02/26/2014 NEGATIVE  NEGATIVE mg/dL Final  . Hgb urine dipstick 02/26/2014 NEGATIVE  NEGATIVE Final  . Bilirubin Urine 02/26/2014 NEGATIVE  NEGATIVE Final  . Ketones, ur 02/26/2014 NEGATIVE  NEGATIVE mg/dL Final  . Protein, ur 02/26/2014 NEGATIVE  NEGATIVE mg/dL Final  . Urobilinogen, UA 02/26/2014 0.2  0.0 - 1.0 mg/dL Final  . Nitrite 02/26/2014 NEGATIVE  NEGATIVE Final  . Leukocytes, UA 02/26/2014 NEGATIVE  NEGATIVE Final   MICROSCOPIC NOT DONE ON URINES WITH NEGATIVE PROTEIN, BLOOD, LEUKOCYTES, NITRITE, OR GLUCOSE <1000 mg/dL.  Marland Kitchen MRSA, PCR 02/26/2014 NEGATIVE  NEGATIVE Final  . Staphylococcus aureus 02/26/2014 NEGATIVE  NEGATIVE Final   Comment:        The Xpert SA Assay (FDA approved for NASAL specimens in patients over 39 years of age), is one component of a comprehensive surveillance program.  Test performance has been validated by [facility] for patients greater than or equal to 54 year old. It is not intended to diagnose infection nor to guide or monitor treatment.      X-Rays:Dg Chest 2 View  02/26/2014   CLINICAL DATA:  Preoperative evaluation for knee surgery  EXAM: CHEST  2 VIEW  COMPARISON:  None.  FINDINGS: Cardiac shadow is  within normal limits. The lungs are clear bilaterally. A compression deformity is noted in the mid upper thoracic spine likely of a chronic nature.  IMPRESSION: No active cardiopulmonary disease.   Electronically Signed   By: Inez Catalina M.D.   On: 02/26/2014 17:04    EKG: Orders placed or performed during the hospital encounter of 03/27/12  . ED EKG  . ED EKG  . EKG 12-Lead  . EKG 12-Lead  . EKG     Hospital Course: Lawrence Wilson is a 81 y.o. who was admitted to [facility]. They were brought to the operating room on 03/05/2014 and underwent Procedure(s): RIGHT TOTAL KNEE ARTHROPLASTY.  Patient tolerated the procedure well and was later transferred to the recovery room and then to the orthopaedic floor for postoperative care.  They were given PO and IV analgesics for pain control following their surgery.  They were given 24 hours of postoperative antibiotics of  Anti-infectives    Start     Dose/Rate Route Frequency Ordered Stop   03/05/14 2000  ceFAZolin (ANCEF) IVPB 2 g/50 mL premix     2 g100 mL/hr over 30 Minutes Intravenous Every 6 hours 03/05/14 1631 03/06/14 0243   03/05/14 1031  ceFAZolin (ANCEF) IVPB 2 g/50 mL premix     2 g100 mL/hr over 30 Minutes Intravenous On call to O.R. 03/05/14 1031 03/05/14 1315     and started on DVT prophylaxis in the form of Xarelto.   PT and OT were ordered for total joint protocol.  Discharge planning consulted to help with postop disposition and  equipment needs.  Patient had a decent night on the evening of surgery.  Increased WBC to 20, likely stress reaction from surgery. They started to get up OOB with therapy on day one. Hemovac drain was pulled without difficulty.  Continued to work with therapy into day two.  Dressing was changed on day two and the incision was healing well.  By day three, the patient had progressed with therapy and meeting their goals.  Incision was healing well.  Patient was seen in rounds on day three and was ready to go  home.   Discharge home with home health Diet - Cardiac diet Follow up - in 2 weeks Activity - WBAT Disposition - Home Condition Upon Discharge - Good D/C Meds - See DC Summary DVT Prophylaxis - Xarelto      Discharge Instructions    Call MD / Call 911    Complete by:  As directed   If you experience chest pain or shortness of breath, CALL 911 and be transported to the hospital emergency room.  If you develope a fever above 101 F, pus (white drainage) or increased drainage or redness at the wound, or calf pain, call your surgeon's office.     Change dressing    Complete by:  As directed   Change dressing daily with sterile 4 x 4 inch gauze dressing and apply TED hose. Do not submerge the incision under water.     Constipation Prevention    Complete by:  As directed   Drink plenty of fluids.  Prune juice may be helpful.  You may use a stool softener, such as Colace (over the counter) 100 mg twice a day.  Use MiraLax (over the counter) for constipation as needed.     Diet - low sodium heart healthy    Complete by:  As directed      Discharge instructions    Complete by:  As directed   Pick up stool softner and laxative for home use following surgery while on pain medications. Do not submerge incision under water. Please use good hand washing techniques while changing dressing each day. May shower starting three days after surgery. Please use a clean towel to pat the incision dry following showers. Continue to use ice for pain and swelling after surgery. Do not use any lotions or creams on the incision until instructed by your surgeon.  Take Xarelto for two and a half more weeks, then discontinue Xarelto. Once the patient has completed the blood thinner regimen, then take a Baby 81 mg Aspirin daily for three more weeks.  Postoperative Constipation Protocol  Constipation - defined medically as fewer than three stools per week and severe constipation as less than one stool per  week.  One of the most common issues patients have following surgery is constipation.  Even if you have a regular bowel pattern at home, your normal regimen is likely to be disrupted due to multiple reasons following surgery.  Combination of anesthesia, postoperative narcotics, change in appetite and fluid intake all can affect your bowels.  In order to avoid complications following surgery, here are some recommendations in order to help you during your recovery period.  Colace (docusate) - Pick up an over-the-counter form of Colace or another stool softener and take twice a day as long as you are requiring postoperative pain medications.  Take with a full glass of water daily.  If you experience loose stools or diarrhea, hold the colace until you stool forms back  up.  If your symptoms do not get better within 1 week or if they get worse, check with your doctor.  Dulcolax (bisacodyl) - Pick up over-the-counter and take as directed by the product packaging as needed to assist with the movement of your bowels.  Take with a full glass of water.  Use this product as needed if not relieved by Colace only.   MiraLax (polyethylene glycol) - Pick up over-the-counter to have on hand.  MiraLax is a solution that will increase the amount of water in your bowels to assist with bowel movements.  Take as directed and can mix with a glass of water, juice, soda, coffee, or tea.  Take if you go more than two days without a movement. Do not use MiraLax more than once per day. Call your doctor if you are still constipated or irregular after using this medication for 7 days in a row.  If you continue to have problems with postoperative constipation, please contact the office for further assistance and recommendations.  If you experience "the worst abdominal pain ever" or develop nausea or vomiting, please contact the office immediatly for further recommendations for treatment.'     Do not put a pillow under the knee. Place  it under the heel.    Complete by:  As directed      Do not sit on low chairs, stoools or toilet seats, as it may be difficult to get up from low surfaces    Complete by:  As directed      Driving restrictions    Complete by:  As directed   No driving until released by the physician.     Increase activity slowly as tolerated    Complete by:  As directed      Lifting restrictions    Complete by:  As directed   No lifting until released by the physician.     Patient may shower    Complete by:  As directed   You may shower without a dressing once there is no drainage.  Do not wash over the wound.  If drainage remains, do not shower until drainage stops.     TED hose    Complete by:  As directed   Use stockings (TED hose) for 3 weeks on both leg(s).  You may remove them at night for sleeping.     Weight bearing as tolerated    Complete by:  As directed   Laterality:  right  Extremity:  Lower            Medication List    STOP taking these medications        BLUE-EMU SUPER STRENGTH Crea     FISH OIL PO     multivitamin with minerals Tabs tablet     naproxen sodium 220 MG tablet  Commonly known as:  ANAPROX     SYSTANE ULTRA OP      TAKE these medications        levothyroxine 25 MCG tablet  Commonly known as:  SYNTHROID, LEVOTHROID  Take 25 mcg by mouth daily.     lisinopril-hydrochlorothiazide 20-12.5 MG per tablet  Commonly known as:  PRINZIDE,ZESTORETIC  Take 1 tablet by mouth daily.     methocarbamol 500 MG tablet  Commonly known as:  ROBAXIN  Take 1 tablet (500 mg total) by mouth every 6 (six) hours as needed for muscle spasms.     oxyCODONE 5 MG immediate release tablet  Commonly known as:  Oxy IR/ROXICODONE  Take 1-2 tablets (5-10 mg total) by mouth every 3 (three) hours as needed for moderate pain, severe pain or breakthrough pain.     rivaroxaban 10 MG Tabs tablet  Commonly known as:  XARELTO  - Take 1 tablet (10 mg total) by mouth daily with  breakfast. Take Xarelto for two and a half more weeks, then discontinue Xarelto.  - Once the patient has completed the blood thinner regimen, then take a Baby 81 mg Aspirin daily for three more weeks.     tamsulosin 0.4 MG Caps capsule  Commonly known as:  FLOMAX  Take 0.4 mg by mouth at bedtime.     traMADol 50 MG tablet  Commonly known as:  ULTRAM  Take 1-2 tablets (50-100 mg total) by mouth every 6 (six) hours as needed (mild pain).       Follow-up Information    Follow up with [facility].   Why:  home health physical therapy   Contact information:   [address] [place] [address] [phone]       Follow up with Gearlean Alf, MD In 2 weeks.   Specialty:  Orthopedic Surgery   Why:  Call office at [phone] to setup follow up appointment with Dr.[name].   Contact information:   [address] [place] [address] [phone]       Signed: Arlee Muslim, PA-C Orthopaedic Surgery 03/13/2014, 10:44 AM

## 2014-03-06 NOTE — Evaluation (Addendum)
Occupational Therapy Evaluation Patient Details Name: Lawrence Wilson MRN: [id number] DOB: 1929/07/02 Today's Date: 03/06/2014    History of Present Illness s/p R TKA   Clinical Impression   Pt tends to move quickly at times and needs cues for safety with walker use. He is familiar with AE and DME from wife's knee surgery in the past. Will benefit from continued OT services to increase safety and independence with self care tasks.    Follow Up Recommendations  No OT follow up;Supervision/Assistance - 24 hour    Equipment Recommendations  None recommended by OT    Recommendations for Other Services       Precautions / Restrictions Precautions Precautions: Knee Precaution Comments: I SLR today, KI not utilized Required Braces or Orthoses: Knee Immobilizer - Right Knee Immobilizer - Right: Discontinue once straight leg raise with < 10 degree lag Restrictions Weight Bearing Restrictions: No Other Position/Activity Restrictions: WBAT      Mobility Bed Mobility                  Transfers Overall transfer level: Needs assistance Equipment used: Rolling walker (2 wheeled) Transfers: Sit to/from Stand Sit to Stand: Min guard         General transfer comment: min cues for safety. tends to move quickly.    Balance                                            ADL Overall ADL's : Needs assistance/impaired Eating/Feeding: Independent;Sitting   Grooming: Wash/dry hands;Set up;Sitting   Upper Body Bathing: Set up;Sitting   Lower Body Bathing: Minimal assistance;Sit to/from stand   Upper Body Dressing : Set up;Sitting   Lower Body Dressing: Minimal assistance;Sit to/from stand   Toilet Transfer: Minimal assistance;Ambulation;BSC;RW;Min guard Armed forces technical officer Details (indicate cue type and reason): pt moves quicly and needs cues for proper walker use. Toileting- Water quality scientist and Hygiene: Min guard;Sit to/from stand         General ADL  Comments: Demonstrated AE use for LB self care. Pt able to doff R sock but not able to don currently. He has all AE from wife's knee surgery and is familiar with use. He tends to move quickly and needs cues to stay closer to walker and to back fully to surface before sitting. reviewed sequence for LB dressing also. Demonstrated shower transfer and safety considerations.      Vision                     Perception     Praxis      Pertinent Vitals/Pain Pain Assessment: 0-10 Pain Score: 5  Pain Location: R knee Pain Descriptors / Indicators: Aching Pain Intervention(s): Repositioned;Ice applied     Hand Dominance     Extremity/Trunk Assessment Upper Extremity Assessment Upper Extremity Assessment: Overall WFL for tasks assessed           Communication Communication Communication: No difficulties   Cognition Arousal/Alertness: Awake/alert Behavior During Therapy: WFL for tasks assessed/performed Overall Cognitive Status: Within Functional Limits for tasks assessed                     General Comments       Exercises       Shoulder Instructions      Home Living Family/patient expects to be discharged to:: Private residence Living Arrangements: Spouse/significant  other Available Help at Discharge: Family Type of Home: House       Home Layout: One level               Home Equipment: Environmental consultant - 2 wheels;Walker - 4 wheels;Bedside commode;Adaptive equipment Adaptive Equipment: Reacher;Sock aid;Long-handled shoe horn;Long-handled sponge Additional Comments: resides at [employer]; handicapped accessible/raised ht toilets, etc      Prior Functioning/Environment Level of Independence: Independent             OT Diagnosis: Generalized weakness   OT Problem List: Decreased strength;Decreased knowledge of use of DME or AE   OT Treatment/Interventions: Self-care/ADL training;Patient/family education;Therapeutic activities;DME and/or AE  instruction    OT Goals(Current goals can be found in the care plan section) Acute Rehab OT Goals Patient Stated Goal: home when ready OT Goal Formulation: With patient Time For Goal Achievement: 03/13/14 Potential to Achieve Goals: Good  OT Frequency: Min 2X/week   Barriers to D/C:            Co-evaluation              End of Session Equipment Utilized During Treatment: Rolling walker  Activity Tolerance: Patient tolerated treatment well Patient left: in chair;with call bell/phone within reach   Time: 1240-1258 OT Time Calculation (min): 18 min Charges:  OT General Charges $OT Visit: 1 Procedure OT Evaluation $Initial OT Evaluation Tier I: 1 Procedure OT Treatments $Therapeutic Activity: 8-22 mins G-Codes:    Jules Schick  [phone] 03/06/2014, 1:20 PM

## 2014-03-06 NOTE — Care Management Note (Signed)
    Page 1 of 2   03/06/2014     1:04:58 PM CARE MANAGEMENT NOTE 03/06/2014  Patient:  KORDE, JEPPSEN   Account Number:  [id number]  Date Initiated:  03/06/2014  Documentation initiated by:  [facility]  Subjective/Objective Assessment:   adm: RIGHT TOTAL KNEE ARTHROPLASTY (Right)     Action/Plan:   discharge planning   Anticipated DC Date:  03/07/2014   Anticipated DC Plan:  [place]  CM consult      [facility] Choice  HOME HEALTH   Choice offered to / List presented to:  C-1 Patient   DME arranged  NA      DME agency  NA     [place] arranged  HH-2 PT      [place]   Status of service:  Completed, signed off Medicare Important Message given?   (If response is "NO", the following Medicare IM given date fields will be blank) Date Medicare IM given:   Medicare IM given by:   Date Additional Medicare IM given:   Additional Medicare IM given by:    Discharge Disposition:  [place]  Per UR Regulation:    If discussed at Long Length of Stay Meetings, dates discussed:    Comments:  03/06/14 11:30 CM met with pot to offer choice of home health agency.  Pt chooses [facility] to render HHPT.  Address and contact information verified by pt.  No DME is needed. Referral given to Shaune Leeks.  No other CM needs were communicated. Mariane Masters, BSN, CM [phone].

## 2014-03-06 NOTE — Discharge Instructions (Signed)
° °Dr. Frank [name] °Total Joint Specialist °[facility] °[address] °[place], [place] [address] °[phone] ° °TOTAL KNEE REPLACEMENT POSTOPERATIVE DIRECTIONS ° ° ° °Knee Rehabilitation, Guidelines Following Surgery  °Results after knee surgery are often greatly improved when you follow the exercise, range of motion and muscle strengthening exercises prescribed by your doctor. Safety measures are also important to protect the knee from further injury. Any time any of these exercises cause you to have increased pain or swelling in your knee joint, decrease the amount until you are comfortable again and slowly increase them. If you have problems or questions, call your caregiver or physical therapist for advice.  ° °HOME CARE INSTRUCTIONS  °Remove items at home which could result in a fall. This includes throw rugs or furniture in walking pathways.  °Continue medications as instructed at time of discharge. °You may have some home medications which will be placed on hold until you complete the course of blood thinner medication.  °You may start showering once you are discharged home but do not submerge the incision under water. Just pat the incision dry and apply a dry gauze dressing on daily. °Walk with walker as instructed.  °You may resume a sexual relationship in one month or when given the OK by  your doctor.  °· Use walker as long as suggested by your caregivers. °· Avoid periods of inactivity such as sitting longer than an hour when not asleep. This helps prevent blood clots.  °You may put full weight on your legs and walk as much as is comfortable.  °You may return to work once you are cleared by your doctor.  °Do not drive a car for 6 weeks or until released by you surgeon.  °· Do not drive while taking narcotics.  °Wear the elastic stockings for three weeks following surgery during the day but you may remove then at night. °Make sure you keep all of your appointments after your  operation with all of your doctors and caregivers. You should call the office at the above phone number and make an appointment for approximately two weeks after the date of your surgery. °Change the dressing daily and reapply a dry dressing each time. °Please pick up a stool softener and laxative for home use as long as you are requiring pain medications. °· ICE to the affected knee every three hours for 30 minutes at a time and then as needed for pain and swelling.  Continue to use ice on the knee for pain and swelling from surgery. You may notice swelling that will progress down to the foot and ankle.  This is normal after surgery.  Elevate the leg when you are not up walking on it.   °It is important for you to complete the blood thinner medication as prescribed by your doctor. °· Continue to use the breathing machine which will help keep your temperature down.  It is common for your temperature to cycle up and down following surgery, especially at night when you are not up moving around and exerting yourself.  The breathing machine keeps your lungs expanded and your temperature down. ° °RANGE OF MOTION AND STRENGTHENING EXERCISES  °Rehabilitation of the knee is important following a knee injury or an operation. After just a few days of immobilization, the muscles of the thigh which control the knee become weakened and shrink (atrophy). Knee exercises are designed to build up the tone and strength of the thigh muscles and to improve knee   motion. Often times heat used for twenty to thirty minutes before working out will loosen up your tissues and help with improving the range of motion but do not use heat for the first two weeks following surgery. These exercises can be done on a training (exercise) mat, on the floor, on a table or on a bed. Use what ever works the best and is most comfortable for you Knee exercises include:  °Leg Lifts - While your knee is still immobilized in a splint or cast, you can do  straight leg raises. Lift the leg to 60 degrees, hold for 3 sec, and slowly lower the leg. Repeat 10-20 times 2-3 times daily. Perform this exercise against resistance later as your knee gets better.  °Quad and Hamstring Sets - Tighten up the muscle on the front of the thigh (Quad) and hold for 5-10 sec. Repeat this 10-20 times hourly. Hamstring sets are done by pushing the foot backward against an object and holding for 5-10 sec. Repeat as with quad sets.  °A rehabilitation program following serious knee injuries can speed recovery and prevent re-injury in the future due to weakened muscles. Contact your doctor or a physical therapist for more information on knee rehabilitation.  ° °SKILLED REHAB INSTRUCTIONS: °If the patient is transferred to a skilled rehab facility following release from the hospital, a list of the current medications will be sent to the facility for the patient to continue.  When discharged from the skilled rehab facility, please have the facility set up the patient's Home Health Physical Therapy prior to being released. Also, the skilled facility will be responsible for providing the patient with their medications at time of release from the facility to include their pain medication, the muscle relaxants, and their blood thinner medication. If the patient is still at the rehab facility at time of the two week follow up appointment, the skilled rehab facility will also need to assist the patient in arranging follow up appointment in our office and any transportation needs. ° °MAKE SURE YOU:  °Understand these instructions.  °Will watch your condition.  °Will get help right away if you are not doing well or get worse.  ° ° °Pick up stool softner and laxative for home use following surgery while on pain medications. °Do not submerge incision under water. °Please use good hand washing techniques while changing dressing each day. °May shower starting three days after surgery. °Please use a clean  towel to pat the incision dry following showers. °Continue to use ice for pain and swelling after surgery. °Do not use any lotions or creams on the incision until instructed by your surgeon. ° °Take Xarelto for two and a half more weeks, then discontinue Xarelto. °Once the patient has completed the blood thinner regimen, then take a Baby 81 mg Aspirin daily for three more weeks. ° °Postoperative Constipation Protocol ° °Constipation - defined medically as fewer than three stools per week and severe constipation as less than one stool per week. ° °One of the most common issues patients have following surgery is constipation.  Even if you have a regular bowel pattern at home, your normal regimen is likely to be disrupted due to multiple reasons following surgery.  Combination of anesthesia, postoperative narcotics, change in appetite and fluid intake all can affect your bowels.  In order to avoid complications following surgery, here are some recommendations in order to help you during your recovery period. ° °Colace (docusate) - Pick up an over-the-counter form of   Colace or another stool softener and take twice a day as long as you are requiring postoperative pain medications.  Take with a full glass of water daily.  If you experience loose stools or diarrhea, hold the colace until you stool forms back up.  If your symptoms do not get better within 1 week or if they get worse, check with your doctor.  Dulcolax (bisacodyl) - Pick up over-the-counter and take as directed by the product packaging as needed to assist with the movement of your bowels.  Take with a full glass of water.  Use this product as needed if not relieved by Colace only.   MiraLax (polyethylene glycol) - Pick up over-the-counter to have on hand.  MiraLax is a solution that will increase the amount of water in your bowels to assist with bowel movements.  Take as directed and can mix with a glass of water, juice, soda, coffee, or tea.  Take if you  go more than two days without a movement. Do not use MiraLax more than once per day. Call your doctor if you are still constipated or irregular after using this medication for 7 days in a row.  If you continue to have problems with postoperative constipation, please contact the office for further assistance and recommendations.  If you experience "the worst abdominal pain ever" or develop nausea or vomiting, please contact the office immediatly for further recommendations for treatment.

## 2014-03-06 NOTE — Progress Notes (Signed)
   Subjective: 1 Day Post-Op Procedure(s) (LRB): RIGHT TOTAL KNEE ARTHROPLASTY (Right) Patient reports pain as mild.   Patient seen in rounds with Dr. Wynelle Link. Patient is well, and has had no acute complaints or problems We will start therapy today.  Plan is to go Home after hospital stay.  Objective: Vital signs in last 24 hours: Temp:  [97.5 F (36.4 C)-99 F (37.2 C)] 98 F (36.7 C) (01/26 0504) Pulse Rate:  [58-77] 59 (01/26 0504) Resp:  [14-16] 15 (01/26 0400) BP: (124-169)/(55-77) 129/57 mmHg (01/26 0504) SpO2:  [91 %-99 %] 96 % (01/26 0504) Weight:  [83.915 kg (185 lb)] 83.915 kg (185 lb) (01/25 1630)  Intake/Output from previous day:  Intake/Output Summary (Last 24 hours) at 03/06/14 0752 Last data filed at 03/06/14 0650  Gross per 24 hour  Intake   4465 ml  Output   1355 ml  Net   3110 ml    Intake/Output this shift: UOP 350 since MN  Labs:  Recent Labs  03/06/14 0520  HGB 11.7*    Recent Labs  03/06/14 0520  WBC 20.0*  RBC 3.86*  HCT 36.3*  PLT 187    Recent Labs  03/06/14 0520  NA 137  K 5.0  CL 105  CO2 26  BUN 27*  CREATININE 1.34  GLUCOSE 154*  CALCIUM 8.1*   No results for input(s): LABPT, INR in the last 72 hours.  EXAM General - Patient is Alert, Appropriate and Oriented Extremity - Neurovascular intact Sensation intact distally Dorsiflexion/Plantar flexion intact  Already doing SLR's Dressing - dressing C/D/I Motor Function - intact, moving foot and toes well on exam.  Hemovac pulled without difficulty.  Past Medical History  Diagnosis Date  . Hypertension   . Hypothyroid   . Shingles   . CLL (chronic lymphocytic leukemia)   . Neuromuscular disorder     peripheral neuropathy   . Sleep apnea     cpap- 9   . Urinary frequency   . Arthritis     Assessment/Plan: 1 Day Post-Op Procedure(s) (LRB): RIGHT TOTAL KNEE ARTHROPLASTY (Right) Principal Problem:   OA (osteoarthritis) of knee  Estimated body mass index  is 27.31 kg/(m^2) as calculated from the following:   Height as of this encounter: 5\' 9"  (1.753 m).   Weight as of this encounter: 83.915 kg (185 lb). Advance diet Up with therapy Plan for discharge tomorrow Discharge home with home health  Increased WBC to 20, likely stress reaction from surgery.  DVT Prophylaxis - Xarelto Weight-Bearing as tolerated to right leg D/C O2 and Pulse OX and try on Room Air  Arlee Muslim, PA-C Orthopaedic Surgery 03/06/2014, 7:52 AM

## 2014-03-07 LAB — CBC
HCT: 32.5 % — ABNORMAL LOW (ref 39.0–52.0)
HEMOGLOBIN: 10.7 g/dL — AB (ref 13.0–17.0)
MCH: 30.5 pg (ref 26.0–34.0)
MCHC: 32.9 g/dL (ref 30.0–36.0)
MCV: 92.6 fL (ref 78.0–100.0)
Platelets: 181 10*3/uL (ref 150–400)
RBC: 3.51 MIL/uL — ABNORMAL LOW (ref 4.22–5.81)
RDW: 14.1 % (ref 11.5–15.5)
WBC: 21.1 10*3/uL — ABNORMAL HIGH (ref 4.0–10.5)

## 2014-03-07 LAB — BASIC METABOLIC PANEL
Anion gap: 8 (ref 5–15)
BUN: 24 mg/dL — ABNORMAL HIGH (ref 6–23)
CO2: 25 mmol/L (ref 19–32)
Calcium: 8.3 mg/dL — ABNORMAL LOW (ref 8.4–10.5)
Chloride: 106 mmol/L (ref 96–112)
Creatinine, Ser: 1.14 mg/dL (ref 0.50–1.35)
GFR, EST AFRICAN AMERICAN: 67 mL/min — AB (ref >90)
GFR, EST NON AFRICAN AMERICAN: 58 mL/min — AB (ref >90)
GLUCOSE: 146 mg/dL — AB (ref 70–99)
Potassium: 4.1 mmol/L (ref 3.5–5.1)
Sodium: 139 mmol/L (ref 135–145)

## 2014-03-07 NOTE — Progress Notes (Signed)
Spoke with pt regarding cpap.  Pt stated he will place it on himself later and does not need help.  NO frays on cord or obvious defects noted.  Sterile water added to humidity chamber.  Pt was advised that RT is available all night should he need further assistance.  Rn aware.

## 2014-03-07 NOTE — Progress Notes (Signed)
   Subjective: 2 Days Post-Op Procedure(s) (LRB): RIGHT TOTAL KNEE ARTHROPLASTY (Right) Patient reports pain as mild and moderate.   Patient seen in rounds by Dr. Wynelle Link. Patient is well, but has had some minor complaints of pain in the knee, requiring pain medications Plan is to go Home after hospital stay.  Needs a little more therapy.  Will plan for tomorrow.  Objective: Vital signs in last 24 hours: Temp:  [98.3 F (36.8 C)-100.3 F (37.9 C)] 100.3 F (37.9 C) (01/27 0544) Pulse Rate:  [66-83] 83 (01/27 0544) Resp:  [14-16] 16 (01/27 0800) BP: (141-156)/(50-77) 147/77 mmHg (01/27 0544) SpO2:  [95 %-99 %] 98 % (01/27 0800)  Intake/Output from previous day:  Intake/Output Summary (Last 24 hours) at 03/07/14 1131 Last data filed at 03/07/14 0900  Gross per 24 hour  Intake    840 ml  Output   3175 ml  Net  -2335 ml    Intake/Output this shift: Total I/O In: 240 [P.O.:240] Out: 600 [Urine:600]  Labs:  Recent Labs  03/06/14 0520 03/07/14 0459  HGB 11.7* 10.7*    Recent Labs  03/06/14 0520 03/07/14 0459  WBC 20.0* 21.1*  RBC 3.86* 3.51*  HCT 36.3* 32.5*  PLT 187 181    Recent Labs  03/06/14 0520 03/07/14 0459  NA 137 139  K 5.0 4.1  CL 105 106  CO2 26 25  BUN 27* 24*  CREATININE 1.34 1.14  GLUCOSE 154* 146*  CALCIUM 8.1* 8.3*   No results for input(s): LABPT, INR in the last 72 hours.  EXAM General - Patient is Alert, Appropriate and Oriented Extremity - Neurovascular intact Sensation intact distally Dressing/Incision - clean, dry, no drainage, healing Motor Function - intact, moving foot and toes well on exam.   Past Medical History  Diagnosis Date  . Hypertension   . Hypothyroid   . Shingles   . CLL (chronic lymphocytic leukemia)   . Neuromuscular disorder     peripheral neuropathy   . Sleep apnea     cpap- 9   . Urinary frequency   . Arthritis     Assessment/Plan: 2 Days Post-Op Procedure(s) (LRB): RIGHT TOTAL KNEE  ARTHROPLASTY (Right) Principal Problem:   OA (osteoarthritis) of knee  Estimated body mass index is 27.31 kg/(m^2) as calculated from the following:   Height as of this encounter: 5\' 9"  (1.753 m).   Weight as of this encounter: 83.915 kg (185 lb). Up with therapy Plan for discharge tomorrow Discharge home with home health  DVT Prophylaxis - Xarelto Weight-Bearing as tolerated to right leg  Arlee Muslim, PA-C Orthopaedic Surgery 03/07/2014, 11:31 AM

## 2014-03-07 NOTE — Progress Notes (Signed)
   03/07/14 1400  PT Visit Information  Last PT Received On 03/07/14  Assistance Needed +1  History of Present Illness s/p R TKA  PT Time Calculation  PT Start Time (ACUTE ONLY) 1356  PT Stop Time (ACUTE ONLY) 1408  PT Time Calculation (min) (ACUTE ONLY) 12 min  Subjective Data  Patient Stated Goal home when ready  Precautions  Precautions Knee  Precaution Comments I SLR today, KI not utilized  Required Braces or Orthoses Knee Immobilizer - Right  Knee Immobilizer - Right Discontinue once straight leg raise with < 10 degree lag  Restrictions  Weight Bearing Restrictions No  Other Position/Activity Restrictions WBAT  Pain Assessment  Pain Assessment 0-10  Pain Score 3  Pain Location R knee  Pain Descriptors / Indicators Burning  Pain Intervention(s) Limited activity within patient's tolerance;Monitored during session;Patient requesting pain meds-RN notified;Repositioned  Cognition  Arousal/Alertness Awake/alert  Behavior During Therapy WFL for tasks assessed/performed  Overall Cognitive Status Within Functional Limits for tasks assessed  Bed Mobility  Overal bed mobility Needs Assistance  Bed Mobility Sit to Supine  Sit to supine Supervision  General bed mobility comments cues for technique  Transfers  Overall transfer level Needs assistance  Equipment used Rolling walker (2 wheeled)  Transfers Sit to/from Stand  Sit to Stand Supervision  General transfer comment occasional, subtle cue for hand placement and LE position  Ambulation/Gait  Ambulation/Gait assistance Supervision  Ambulation Distance (Feet) 150 Feet  Assistive device Rolling walker (2 wheeled)  Gait Pattern/deviations Step-to pattern;Antalgic  General Gait Details cues for RW safety, especially with turns  Total Joint Exercises  Ankle Circles/Pumps AROM;Both;10 reps  Quad Sets 10 reps;AROM;Strengthening;Both  Heel Slides AROM;10 reps;AAROM;Right  Goniometric ROM -8 to 84* flexion  PT - End of Session   Equipment Utilized During Treatment Gait belt  Activity Tolerance Patient tolerated treatment well  Patient left in bed;with call bell/phone within reach;with family/visitor present  Nurse Communication Mobility status  PT - Assessment/Plan  PT Plan Current plan remains appropriate  PT Frequency (ACUTE ONLY) 7X/week  Follow Up Recommendations Home health PT  PT equipment None recommended by PT  PT Goal Progression  Progress towards PT goals Progressing toward goals  Acute Rehab PT Goals  PT Goal Formulation With patient  Time For Goal Achievement 03/09/14  Potential to Achieve Goals Good  PT General Charges  $$ ACUTE PT VISIT 1 Procedure  PT Treatments  $Gait Training 8-22 mins

## 2014-03-07 NOTE — Progress Notes (Signed)
   Subjective: 2 Days Post-Op Procedure(s) (LRB): RIGHT TOTAL KNEE ARTHROPLASTY (Right) Patient reports pain as mild.   Plan is to go Home after hospital stay.  Objective: Vital signs in last 24 hours: Temp:  [97.1 F (36.2 C)-100.3 F (37.9 C)] 100.3 F (37.9 C) (01/27 0544) Pulse Rate:  [62-83] 83 (01/27 0544) Resp:  [14-16] 15 (01/27 0544) BP: (138-156)/(50-77) 147/77 mmHg (01/27 0544) SpO2:  [95 %-99 %] 95 % (01/27 0544)  Intake/Output from previous day:  Intake/Output Summary (Last 24 hours) at 03/07/14 0928 Last data filed at 03/07/14 0600  Gross per 24 hour  Intake    630 ml  Output   2875 ml  Net  -2245 ml    Intake/Output this shift:    Labs:  Recent Labs  03/06/14 0520 03/07/14 0459  HGB 11.7* 10.7*    Recent Labs  03/06/14 0520 03/07/14 0459  WBC 20.0* 21.1*  RBC 3.86* 3.51*  HCT 36.3* 32.5*  PLT 187 181    Recent Labs  03/06/14 0520 03/07/14 0459  NA 137 139  K 5.0 4.1  CL 105 106  CO2 26 25  BUN 27* 24*  CREATININE 1.34 1.14  GLUCOSE 154* 146*  CALCIUM 8.1* 8.3*   No results for input(s): LABPT, INR in the last 72 hours.  EXAM General - Patient is Alert, Appropriate and Oriented Extremity - Neurologically intact Neurovascular intact No cellulitis present Compartment soft Dressing/Incision - clean, dry, no drainage Motor Function - intact, moving foot and toes well on exam.   Past Medical History  Diagnosis Date  . Hypertension   . Hypothyroid   . Shingles   . CLL (chronic lymphocytic leukemia)   . Neuromuscular disorder     peripheral neuropathy   . Sleep apnea     cpap- 9   . Urinary frequency   . Arthritis     Assessment/Plan: 2 Days Post-Op Procedure(s) (LRB): RIGHT TOTAL KNEE ARTHROPLASTY (Right) Principal Problem:   OA (osteoarthritis) of knee   Up with therapy Plan for discharge tomorrow  DVT Prophylaxis - Xarelto Weight-Bearing as tolerated to right leg  Sadeen Wiegel V 03/07/2014, 9:28 AM

## 2014-03-07 NOTE — Progress Notes (Signed)
Physical Therapy Treatment Patient Details Name: Lawrence Wilson MRN: [id number] DOB: 1930-01-13 Today's Date: 2014/03/27    History of Present Illness s/p R TKA    PT Comments    Pt progressing well  Follow Up Recommendations  Home health PT     Equipment Recommendations  None recommended by PT    Recommendations for Other Services       Precautions / Restrictions Precautions Precautions: Knee Precaution Comments: I SLR today, KI not utilized Required Braces or Orthoses: Knee Immobilizer - Right Knee Immobilizer - Right: Discontinue once straight leg raise with < 10 degree lag Restrictions Other Position/Activity Restrictions: WBAT    Mobility  Bed Mobility                  Transfers Overall transfer level: Needs assistance Equipment used: Rolling walker (2 wheeled) Transfers: Sit to/from Stand Sit to Stand: Min guard         General transfer comment: cues for hand placement and LE position  Ambulation/Gait Ambulation/Gait assistance: Supervision Ambulation Distance (Feet): 120 Feet Assistive device: Rolling walker (2 wheeled) Gait Pattern/deviations: Step-to pattern;Antalgic     General Gait Details: cues for RW safety, especially with turns   Stairs            Wheelchair Mobility    Modified Rankin (Stroke Patients Only)       Balance                                    Cognition Arousal/Alertness: Awake/alert Behavior During Therapy: WFL for tasks assessed/performed Overall Cognitive Status: Within Functional Limits for tasks assessed                      Exercises Total Joint Exercises Ankle Circles/Pumps: AROM;Both;10 reps Quad Sets: 10 reps;AROM;Strengthening;Both Heel Slides: AROM;10 reps;AAROM;Right Hip ABduction/ADduction: AROM;AAROM;Strengthening;10 reps;Right Straight Leg Raises: AROM;Both;10 reps Long Arc Quad: AROM;Right;10 reps;Seated Knee Flexion: AAROM;Right;5 reps;Seated    General  Comments        Pertinent Vitals/Pain Pain Assessment: 0-10 Pain Score: 3  Pain Location: R knee Pain Descriptors / Indicators: Sore Pain Intervention(s): Limited activity within patient's tolerance    Home Living                      Prior Function            PT Goals (current goals can now be found in the care plan section) Acute Rehab PT Goals Patient Stated Goal: home when ready PT Goal Formulation: With patient Time For Goal Achievement: 03/09/14 Potential to Achieve Goals: Good Progress towards PT goals: Progressing toward goals    Frequency  7X/week    PT Plan Current plan remains appropriate    Co-evaluation             End of Session Equipment Utilized During Treatment: Gait belt Activity Tolerance: Patient tolerated treatment well Patient left: in chair;with call bell/phone within reach;with family/visitor present     Time: 1105-1120 PT Time Calculation (min) (ACUTE ONLY): 15 min  Charges:  $Gait Training: 8-22 mins                    G Codes:      Jimmylee Ratterree 03-27-2014, 1:45 PM

## 2014-03-08 LAB — CBC
HEMATOCRIT: 29.2 % — AB (ref 39.0–52.0)
Hemoglobin: 9.6 g/dL — ABNORMAL LOW (ref 13.0–17.0)
MCH: 30.7 pg (ref 26.0–34.0)
MCHC: 32.9 g/dL (ref 30.0–36.0)
MCV: 93.3 fL (ref 78.0–100.0)
Platelets: 166 10*3/uL (ref 150–400)
RBC: 3.13 MIL/uL — ABNORMAL LOW (ref 4.22–5.81)
RDW: 14.4 % (ref 11.5–15.5)
WBC: 15.9 10*3/uL — AB (ref 4.0–10.5)

## 2014-03-08 MED ORDER — RIVAROXABAN 10 MG PO TABS: 10.0000 mg | ORAL_TABLET | Freq: Every day | ORAL | Status: DC

## 2014-03-08 MED ORDER — OXYCODONE HCL 5 MG PO TABS: 5.0000 mg | ORAL_TABLET | ORAL | Status: DC | PRN

## 2014-03-08 MED ORDER — TRAMADOL HCL 50 MG PO TABS: 50.0000 mg | ORAL_TABLET | Freq: Four times a day (QID) | ORAL | Status: DC | PRN

## 2014-03-08 MED ORDER — METHOCARBAMOL 500 MG PO TABS: 500.0000 mg | ORAL_TABLET | Freq: Four times a day (QID) | ORAL | Status: DC | PRN

## 2014-03-08 NOTE — Progress Notes (Signed)
Discharge instructions given to pt with all questions answered. Pt accepted teaching well.

## 2014-03-08 NOTE — Progress Notes (Signed)
Physical Therapy Treatment Patient Details Name: Lawrence Wilson MRN: [id number] DOB: 1929/08/07 Today's Date: 2014/03/14    History of Present Illness s/p R TKA    PT Comments    Pt progressing very well; feels comfortable with D/C.  Follow Up Recommendations  Home health PT     Equipment Recommendations  None recommended by PT    Recommendations for Other Services       Precautions / Restrictions Precautions Precautions: Knee Precaution Comments: I SLR today, KI not utilized Required Braces or Orthoses: Knee Immobilizer - Right Knee Immobilizer - Right: Discontinue once straight leg raise with < 10 degree lag Restrictions Weight Bearing Restrictions: No Other Position/Activity Restrictions: WBAT    Mobility  Bed Mobility                  Transfers Overall transfer level: Needs assistance Equipment used: Rolling walker (2 wheeled) Transfers: Sit to/from Stand Sit to Stand: Supervision;Modified independent (Device/Increase time)         General transfer comment: pt self corrects  Ambulation/Gait Ambulation/Gait assistance: Supervision Ambulation Distance (Feet): 200 Feet Assistive device: Rolling walker (2 wheeled) Gait Pattern/deviations: Step-to pattern;Step-through pattern;Antalgic     General Gait Details: cues for  RW position   Stairs            Wheelchair Mobility    Modified Rankin (Stroke Patients Only)       Balance                                    Cognition Arousal/Alertness: Awake/alert Behavior During Therapy: WFL for tasks assessed/performed Overall Cognitive Status: Within Functional Limits for tasks assessed                      Exercises Total Joint Exercises Ankle Circles/Pumps: AROM;Both;10 reps Quad Sets: 10 reps;AROM;Strengthening;Both Heel Slides: AROM;10 reps;AAROM;Right Hip ABduction/ADduction: AROM;AAROM;Strengthening;10 reps;Right Straight Leg Raises: AROM;Both;10 reps Knee  Flexion: AAROM;Right;Seated;10 reps Goniometric ROM: 16 to 82*    General Comments        Pertinent Vitals/Pain Pain Assessment: 0-10 Pain Score: 3  Pain Location: R knee Pain Descriptors / Indicators: Sore Pain Intervention(s): Limited activity within patient's tolerance;Monitored during session;Premedicated before session    Home Living                      Prior Function            PT Goals (current goals can now be found in the care plan section) Acute Rehab PT Goals Patient Stated Goal: home when ready PT Goal Formulation: With patient Time For Goal Achievement: 03/09/14 Potential to Achieve Goals: Good Progress towards PT goals: Progressing toward goals    Frequency  7X/week    PT Plan Current plan remains appropriate    Co-evaluation             End of Session Equipment Utilized During Treatment: Gait belt Activity Tolerance: Patient tolerated treatment well Patient left: in chair;with call bell/phone within reach     Time: 1093-2355 PT Time Calculation (min) (ACUTE ONLY): 17 min  Charges:  $Gait Training: 8-22 mins                    G Codes:      Lawrence Wilson 14-Mar-2014, 10:24 AM

## 2014-03-08 NOTE — Progress Notes (Signed)
   Subjective: 3 Days Post-Op Procedure(s) (LRB): RIGHT TOTAL KNEE ARTHROPLASTY (Right) Patient reports pain as mild.   Patient seen in rounds with Dr. Wynelle Link. Patient is well, and has had no acute complaints or problems Patient is ready to go home after therapy today.  Objective: Vital signs in last 24 hours: Temp:  [98.6 F (37 C)-99.2 F (37.3 C)] 99.2 F (37.3 C) (01/28 0536) Pulse Rate:  [72-86] 72 (01/28 0536) Resp:  [15-18] 16 (01/28 0536) BP: (143-161)/(58-63) 143/63 mmHg (01/28 0536) SpO2:  [95 %-98 %] 97 % (01/28 0536)  Intake/Output from previous day:  Intake/Output Summary (Last 24 hours) at 03/08/14 0806 Last data filed at 03/08/14 0700  Gross per 24 hour  Intake   1260 ml  Output   2580 ml  Net  -1320 ml    Labs:  Recent Labs  03/06/14 0520 03/07/14 0459 03/08/14 0548  HGB 11.7* 10.7* 9.6*    Recent Labs  03/07/14 0459 03/08/14 0548  WBC 21.1* 15.9*  RBC 3.51* 3.13*  HCT 32.5* 29.2*  PLT 181 166    Recent Labs  03/06/14 0520 03/07/14 0459  NA 137 139  K 5.0 4.1  CL 105 106  CO2 26 25  BUN 27* 24*  CREATININE 1.34 1.14  GLUCOSE 154* 146*  CALCIUM 8.1* 8.3*   No results for input(s): LABPT, INR in the last 72 hours.  EXAM: General - Patient is Alert, Appropriate and Oriented Extremity - Neurovascular intact Sensation intact distally Dorsiflexion/Plantar flexion intact Incision - clean, dry, no drainage, healing Motor Function - intact, moving foot and toes well on exam.   Assessment/Plan: 3 Days Post-Op Procedure(s) (LRB): RIGHT TOTAL KNEE ARTHROPLASTY (Right) Procedure(s) (LRB): RIGHT TOTAL KNEE ARTHROPLASTY (Right) Past Medical History  Diagnosis Date  . Hypertension   . Hypothyroid   . Shingles   . CLL (chronic lymphocytic leukemia)   . Neuromuscular disorder     peripheral neuropathy   . Sleep apnea     cpap- 9   . Urinary frequency   . Arthritis    Principal Problem:   OA (osteoarthritis) of knee  Estimated  body mass index is 27.31 kg/(m^2) as calculated from the following:   Height as of this encounter: 5\' 9"  (1.753 m).   Weight as of this encounter: 83.915 kg (185 lb). Up with therapy Discharge home with home health Diet - Cardiac diet Follow up - in 2 weeks Activity - WBAT Disposition - Home Condition Upon Discharge - Good D/C Meds - See DC Summary DVT Prophylaxis - Xarelto  Arlee Muslim, PA-C Orthopaedic Surgery 03/08/2014, 8:06 AM

## 2015-02-01 ENCOUNTER — Ambulatory Visit: Payer: Self-pay | Admitting: Orthopedic Surgery

## 2015-02-01 NOTE — Progress Notes (Signed)
Preoperative surgical orders have been place into the Epic hospital system for Lawrence Wilson on 02/01/2015, 1:41 PM  by Mickel Crow for surgery on 02-25-15.  Preop Total Knee orders including Experal, IV Tylenol, and IV Decadron as long as there are no contraindications to the above medications. Arlee Muslim, PA-C

## 2015-02-15 NOTE — Patient Instructions (Addendum)
YOUR PROCEDURE IS SCHEDULED ON : 02/25/15  REPORT TO [facility] MAIN ENTRANCE FOLLOW SIGNS TO EAST ELEVATOR - GO TO 3rd FLOOR CHECK IN AT 3 EAST NURSES STATION (SHORT STAY) AT:  7:30 AM  CALL THIS NUMBER IF YOU HAVE PROBLEMS THE MORNING OF SURGERY [phone]  REMEMBER:ONLY 1 PER PERSON MAY GO TO SHORT STAY WITH YOU TO GET READY THE MORNING OF YOUR SURGERY  DO NOT EAT FOOD OR DRINK LIQUIDS AFTER MIDNIGHT  TAKE THESE MEDICINES THE MORNING OF SURGERY: LEVOTHYROXINE  YOU MAY NOT HAVE ANY METAL ON YOUR BODY INCLUDING HAIR PINS AND PIERCING'S. DO NOT WEAR JEWELRY, MAKEUP, LOTIONS, POWDERS OR PERFUMES. DO NOT WEAR NAIL POLISH. DO NOT SHAVE 48 HRS PRIOR TO SURGERY. MEN MAY SHAVE FACE AND NECK.  DO NOT [place]. [place] IS NOT RESPONSIBLE FOR VALUABLES.  CONTACTS, DENTURES OR PARTIALS MAY NOT BE WORN TO SURGERY. LEAVE SUITCASE IN CAR. CAN BE BROUGHT TO ROOM AFTER SURGERY.  PATIENTS DISCHARGED THE DAY OF SURGERY WILL NOT BE ALLOWED TO DRIVE HOME.  PLEASE READ OVER THE FOLLOWING INSTRUCTION SHEETS _________________________________________________________________________________                                          [facility] - PREPARING FOR SURGERY  Before surgery, you can play an important role.  Because skin is not sterile, your skin needs to be as free of germs as possible.  You can reduce the number of germs on your skin by washing with CHG (chlorahexidine gluconate) soap before surgery.  CHG is an antiseptic cleaner which kills germs and bonds with the skin to continue killing germs even after washing. Please DO NOT use if you have an allergy to CHG or antibacterial soaps.  If your skin becomes reddened/irritated stop using the CHG and inform your nurse when you arrive at Short Stay. Do not shave (including legs and underarms) for at least 48 hours prior to the first CHG shower.  You may shave your face. Please follow these instructions  carefully:   1.  Shower with CHG Soap the night before surgery and the  morning of Surgery.   2.  If you choose to wash your hair, wash your hair first as usual with your  normal  Shampoo.   3.  After you shampoo, rinse your hair and body thoroughly to remove the  shampoo.                                         4.  Use CHG as you would any other liquid soap.  You can apply chg directly  to the skin and wash . Gently wash with scrungie or clean wascloth    5.  Apply the CHG Soap to your body ONLY FROM THE NECK DOWN.   Do not use on open                           Wound or open sores. Avoid contact with eyes, ears mouth and genitals (private parts).                        Genitals (private parts) with your normal soap.  6.  Wash thoroughly, paying special attention to the area where your surgery  will be performed.   7.  Thoroughly rinse your body with warm water from the neck down.   8.  DO NOT shower/wash with your normal soap after using and rinsing off  the CHG Soap .                9.  Pat yourself dry with a clean towel.             10.  Wear clean night clothes to bed after shower             11.  Place clean sheets on your bed the night of your first shower and do not  sleep with pets.  Day of Surgery : Do not apply any lotions/deodorants the morning of surgery.  Please wear clean clothes to the hospital/surgery center.  FAILURE TO FOLLOW THESE INSTRUCTIONS MAY RESULT IN THE CANCELLATION OF YOUR SURGERY    PATIENT SIGNATURE_________________________________  ______________________________________________________________________     Adam Phenix  An incentive spirometer is a tool that can help keep your lungs clear and active. This tool measures how well you are filling your lungs with each breath. Taking long deep breaths may help reverse or decrease the chance of developing breathing (pulmonary) problems (especially infection) following:  A long  period of time when you are unable to move or be active. BEFORE THE PROCEDURE   If the spirometer includes an indicator to show your best effort, your nurse or respiratory therapist will set it to a desired goal.  If possible, sit up straight or lean slightly forward. Try not to slouch.  Hold the incentive spirometer in an upright position. INSTRUCTIONS FOR USE   Sit on the edge of your bed if possible, or sit up as far as you can in bed or on a chair.  Hold the incentive spirometer in an upright position.  Breathe out normally.  Place the mouthpiece in your mouth and seal your lips tightly around it.  Breathe in slowly and as deeply as possible, raising the piston or the ball toward the top of the column.  Hold your breath for 3-5 seconds or for as long as possible. Allow the piston or ball to fall to the bottom of the column.  Remove the mouthpiece from your mouth and breathe out normally.  Rest for a few seconds and repeat Steps 1 through 7 at least 10 times every 1-2 hours when you are awake. Take your time and take a few normal breaths between deep breaths.  The spirometer may include an indicator to show your best effort. Use the indicator as a goal to work toward during each repetition.  After each set of 10 deep breaths, practice coughing to be sure your lungs are clear. If you have an incision (the cut made at the time of surgery), support your incision when coughing by placing a pillow or rolled up towels firmly against it. Once you are able to get out of bed, walk around indoors and cough well. You may stop using the incentive spirometer when instructed by your caregiver.  RISKS AND COMPLICATIONS  Take your time so you do not get dizzy or light-headed.  If you are in pain, you may need to take or ask for pain medication before doing incentive spirometry. It is harder to take a deep breath if you are having pain. AFTER USE  Rest and breathe slowly and easily.  It can  be helpful to keep track of a log of your progress. Your caregiver can provide you with a simple table to help with this. If you are using the spirometer at home, follow these instructions: [place] IF:   You are having difficultly using the spirometer.  You have trouble using the spirometer as often as instructed.  Your pain medication is not giving enough relief while using the spirometer.  You develop fever of 100.5 F (38.1 C) or higher. SEEK IMMEDIATE MEDICAL CARE IF:   You cough up bloody sputum that had not been present before.  You develop fever of 102 F (38.9 C) or greater.  You develop worsening pain at or near the incision site. MAKE SURE YOU:   Understand these instructions.  Will watch your condition.  Will get help right away if you are not doing well or get worse. Document Released: 06/08/2006 Document Revised: 04/20/2011 Document Reviewed: 08/09/2006 [employer] Patient Information 2014 [employer], [place].   ________________________________________________________________________  WHAT IS A BLOOD TRANSFUSION? Blood Transfusion Information  A transfusion is the replacement of blood or some of its parts. Blood is made up of multiple cells which provide different functions.  Red blood cells carry oxygen and are used for blood loss replacement.  White blood cells fight against infection.  Platelets control bleeding.  Plasma helps clot blood.  Other blood products are available for specialized needs, such as hemophilia or other clotting disorders. BEFORE THE TRANSFUSION  Who gives blood for transfusions?   Healthy volunteers who are fully evaluated to make sure their blood is safe. This is blood bank blood. Transfusion therapy is the safest it has ever been in the practice of medicine. Before blood is taken from a donor, a complete history is taken to make sure that person has no history of diseases nor engages in risky social behavior (examples are  intravenous drug use or sexual activity with multiple partners). The donor's travel history is screened to minimize risk of transmitting infections, such as malaria. The donated blood is tested for signs of infectious diseases, such as HIV and hepatitis. The blood is then tested to be sure it is compatible with you in order to minimize the chance of a transfusion reaction. If you or a relative donates blood, this is often done in anticipation of surgery and is not appropriate for emergency situations. It takes many days to process the donated blood. RISKS AND COMPLICATIONS Although transfusion therapy is very safe and saves many lives, the main dangers of transfusion include:   Getting an infectious disease.  Developing a transfusion reaction. This is an allergic reaction to something in the blood you were given. Every precaution is taken to prevent this. The decision to have a blood transfusion has been considered carefully by your caregiver before blood is given. Blood is not given unless the benefits outweigh the risks. AFTER THE TRANSFUSION  Right after receiving a blood transfusion, you will usually feel much better and more energetic. This is especially true if your red blood cells have gotten low (anemic). The transfusion raises the level of the red blood cells which carry oxygen, and this usually causes an energy increase.  The nurse administering the transfusion will monitor you carefully for complications. HOME CARE INSTRUCTIONS  No special instructions are needed after a transfusion. You may find your energy is better. Speak with your caregiver about any limitations on activity for underlying diseases you may have. SEEK MEDICAL CARE IF:   Your  condition is not improving after your transfusion.  You develop redness or irritation at the intravenous (IV) site. SEEK IMMEDIATE MEDICAL CARE IF:  Any of the following symptoms occur over the next 12 hours:  Shaking chills.  You have a  temperature by mouth above 102 F (38.9 C), not controlled by medicine.  Chest, back, or muscle pain.  People around you feel you are not acting correctly or are confused.  Shortness of breath or difficulty breathing.  Dizziness and fainting.  You get a rash or develop hives.  You have a decrease in urine output.  Your urine turns a dark color or changes to pink, red, or brown. Any of the following symptoms occur over the next 10 days:  You have a temperature by mouth above 102 F (38.9 C), not controlled by medicine.  Shortness of breath.  Weakness after normal activity.  The white part of the eye turns yellow (jaundice).  You have a decrease in the amount of urine or are urinating less often.  Your urine turns a dark color or changes to pink, red, or brown. Document Released: 01/24/2000 Document Revised: 04/20/2011 Document Reviewed: 09/12/2007 [facility] Patient Information 2014 [place], [place].  _______________________________________________________________________

## 2015-02-19 ENCOUNTER — Encounter (HOSPITAL_COMMUNITY)
Admission: RE | Admit: 2015-02-19 | Discharge: 2015-02-19 | Disposition: A | Discharge status: Home / Self Care | Payer: Medicare Other | Source: Ambulatory Visit | Attending: Orthopedic Surgery | Admitting: Orthopedic Surgery

## 2015-02-19 ENCOUNTER — Encounter (HOSPITAL_COMMUNITY): Payer: Self-pay

## 2015-02-19 DIAGNOSIS — Z01812 Encounter for preprocedural laboratory examination: Secondary | ICD-10-CM | POA: Insufficient documentation

## 2015-02-19 HISTORY — DX: Gastro-esophageal reflux disease without esophagitis: K21.9

## 2015-02-19 HISTORY — DX: Personal history of other malignant neoplasm of skin: Z85.828

## 2015-02-19 HISTORY — DX: Malignant neoplasm of colon, unspecified: C18.9

## 2015-02-19 LAB — COMPREHENSIVE METABOLIC PANEL
ALBUMIN: 3.9 g/dL (ref 3.5–5.0)
ALT: 37 U/L (ref 17–63)
AST: 27 U/L (ref 15–41)
Alkaline Phosphatase: 58 U/L (ref 38–126)
Anion gap: 8 (ref 5–15)
BUN: 29 mg/dL — AB (ref 6–20)
CHLORIDE: 110 mmol/L (ref 101–111)
CO2: 25 mmol/L (ref 22–32)
CREATININE: 1.23 mg/dL (ref 0.61–1.24)
Calcium: 9.1 mg/dL (ref 8.9–10.3)
GFR calc Af Amer: >60 mL/min (ref >60)
GFR calc non Af Amer: 52 mL/min — ABNORMAL LOW (ref >60)
Glucose, Bld: 151 mg/dL — ABNORMAL HIGH (ref 65–99)
Potassium: 4.7 mmol/L (ref 3.5–5.1)
Sodium: 143 mmol/L (ref 135–145)
Total Bilirubin: 0.6 mg/dL (ref 0.3–1.2)
Total Protein: 6.4 g/dL — ABNORMAL LOW (ref 6.5–8.1)

## 2015-02-19 LAB — CBC
HEMATOCRIT: 40.5 % (ref 39.0–52.0)
Hemoglobin: 13 g/dL (ref 13.0–17.0)
MCH: 30.7 pg (ref 26.0–34.0)
MCHC: 32.1 g/dL (ref 30.0–36.0)
MCV: 95.7 fL (ref 78.0–100.0)
PLATELETS: 259 10*3/uL (ref 150–400)
RBC: 4.23 MIL/uL (ref 4.22–5.81)
RDW: 14.2 % (ref 11.5–15.5)
WBC: 17.4 10*3/uL — ABNORMAL HIGH (ref 4.0–10.5)

## 2015-02-19 LAB — SURGICAL PCR SCREEN
MRSA, PCR: NEGATIVE
Staphylococcus aureus: NEGATIVE

## 2015-02-19 LAB — URINALYSIS, ROUTINE W REFLEX MICROSCOPIC
BILIRUBIN URINE: NEGATIVE
GLUCOSE, UA: NEGATIVE mg/dL
Hgb urine dipstick: NEGATIVE
Ketones, ur: NEGATIVE mg/dL
Nitrite: NEGATIVE
PROTEIN: NEGATIVE mg/dL
Specific Gravity, Urine: 1.029 (ref 1.005–1.030)
pH: 5.5 (ref 5.0–8.0)

## 2015-02-19 LAB — URINE MICROSCOPIC-ADD ON

## 2015-02-19 LAB — PROTIME-INR
INR: 1.07 (ref 0.00–1.49)
Prothrombin Time: 14.1 seconds (ref 11.6–15.2)

## 2015-02-19 LAB — APTT: APTT: 26 s (ref 24–37)

## 2015-02-19 LAB — TYPE AND SCREEN
ABO/RH(D): B POS
ANTIBODY SCREEN: NEGATIVE

## 2015-02-19 NOTE — Progress Notes (Signed)
Abnormal CBC / CMET / UA faxed to Dr. Wynelle Link

## 2015-02-24 ENCOUNTER — Ambulatory Visit: Payer: Self-pay | Admitting: Orthopedic Surgery

## 2015-02-24 NOTE — H&P (Signed)
Lawrence Wilson DOB: 11/06/1929 Married / Language: English / Race: White Male Date of Admission:  02/25/2015 CC:  Left Knee Pain History of Present Illness  The patient is a 82 year old male who comes in for a preoperative History and Physical. The patient is scheduled for a left total knee arthroplasty to be performed by Dr. [name]. [name], MD at [facility] on 02-25-2015. The patient is a 82 year old male who presented with bilateral knee complaints. The patient was initially seen for a second opinion (for right greater than left knee pain). The patient reported left knee and right knee symptoms including: pain, weakness, stiffness and soreness. The patient has the current diagnosis of knee osteoarthritis. Prior to being seen in the practice, the patient was previously evaluated by a colleague (Dr. [name]). Previous work-up for this problem has included knee x-rays. Past treatment for this problem has included intra-articular injection of corticosteroids. He has had more trouble with the knees in the past 2 years, right greater than left. He has since had the right knee repaced about a year ago and has rehabed it well. He said his right knee is doing really well at this time. He denies groin pain. No injuries to the knees. With regards to the the left knee, he typically has pain with weightbearing, not at rest. He is to the point now that he would like to move forward with the left side at this time. The patient states that he is doing very well with the right total knee at this time, however, the left knee continues to hold him back. He is now ready to proceed with sugery on the left knee at this time. They have been treated conservatively in the past for the above stated problem and despite conservative measures, they continue to have progressive pain and severe functional limitations and dysfunction. They have failed non-operative management including home exercise, medications, and  injections. It is felt that they would benefit from undergoing total joint replacement. Risks and benefits of the procedure have been discussed with the patient and they elect to proceed with surgery. There are no active contraindications to surgery such as ongoing infection or rapidly progressive neurological disease.  Problem List/Past Medical Chronic lymphocytic leukemia (C91.10)  Status post total right knee replacement [id number]  Primary osteoarthritis of left knee (M17.12)  Malignant Neoplasm of the Skin  Herpes Zoster  Myopia Both Eyes  Impaired Memory  Hypothyroidism  Chronic kidney disease  Stage III High blood pressure  Peripheral Neuropathy  Skin Cancer  Sleep Apnea  uses CPAP Shingles  Measles  Mumps  Colon Cancer  Benign Neoplasm of Colon  Chronic Rhinitis  Contact Dermatitis  Cataract  Bilateral Enlarged Prostate  Allergies No Known Drug Allergies  Family History Heart Disease  Brother, Father. Cancer  Mother, Sister. Congestive Heart Failure  Father. Hypertension  Mother.  Social History No history of drug/alcohol rehab  Not under pain contract  Number of flights of stairs before winded  greater than 5 [facility] [place] situation  live with spouse Marital status  married Exercise  Exercises weekly; does other and gym / weights Current work status  retired Current drinker  10/20/2013: Currently drinks beer only occasionally per week Tobacco use  Never smoker. 10/20/2013 Post-Surgical Plans  Home with his wife following the Left Total Knee Repalcement on 02/25/2015. He is a resident of [place].  Medication History  Fish Oil Active. Multivitamin (Oral) Active.  Aleve (220MG  Tablet, Oral) Active. Levothyroxine Sodium (25MCG Tablet, Oral) Active. Lisinopril-Hydrochlorothiazide (20-12.5MG  Tablet, Oral) Active. Tamsulosin HCl (0.4MG  Capsule, Oral) Active.  Past Surgical  History Inguinal Hernia Repair  Date: 1980. open: left Colectomy  Date: 2010. partial Adnoidectomy  Colon Polyp Removal - Colonoscopy  Tonsillectomy  Colon Polyp Removal - Open  Total Knee Replacement - Right  Date: 02/2014.   Review of Systems General Not Present- Chills, Fatigue, Fever, Memory Loss, Night Sweats, Weight Gain and Weight Loss. Skin Not Present- Eczema, Hives, Itching, Lesions and Rash. HEENT Not Present- Dentures, Double Vision, Headache, Hearing Loss, Tinnitus and Visual Loss. Respiratory Not Present- Allergies, Chronic Cough, Coughing up blood, Shortness of breath at rest and Shortness of breath with exertion. Cardiovascular Not Present- Chest Pain, Difficulty Breathing Lying Down, Murmur, Palpitations, Racing/skipping heartbeats and Swelling. Gastrointestinal Not Present- Abdominal Pain, Bloody Stool, Constipation, Diarrhea, Difficulty Swallowing, Heartburn, Jaundice, Loss of appetitie, Nausea and Vomiting. Male Genitourinary Present- Urinary frequency, Urinating at Night and Weak urinary stream. Not Present- Blood in Urine, Discharge, Flank Pain, Incontinence, Painful Urination, Urgency and Urinary Retention. Musculoskeletal Present- Joint Pain and Morning Stiffness. Not Present- Back Pain, Joint Swelling, Muscle Pain, Muscle Weakness and Spasms. Neurological Not Present- Blackout spells, Difficulty with balance, Dizziness, Paralysis, Tremor and Weakness. Psychiatric Not Present- Insomnia.  Vitals Weight: 202 lb Height: 69in Weight was reported by patient. Height was reported by patient. Body Surface Area: 2.07 m Body Mass Index: 29.83 kg/m  BP: 142/68 (Sitting, Right Arm, Standard)  Physical Exam General Mental Status -Alert, cooperative and good historian. General Appearance-pleasant, Not in acute distress. Orientation-Oriented X3. Build & Nutrition-Well nourished and Well developed.  Head and Neck Head-normocephalic, atraumatic  . Neck Global Assessment - supple, no bruit auscultated on the right, no bruit auscultated on the left.  Eye Pupil - Bilateral-Regular and Round. Motion - Bilateral-EOMI.  Chest and Lung Exam Auscultation Breath sounds - clear at anterior chest wall and clear at posterior chest wall. Adventitious sounds - No Adventitious sounds.  Cardiovascular Auscultation Rhythm - Regular rate and rhythm. Heart Sounds - S1 WNL and S2 WNL. Murmurs & Other Heart Sounds - Auscultation of the heart reveals - No Murmurs.  Abdomen Inspection Contour - Generalized mild distention. Palpation/Percussion Tenderness - Abdomen is non-tender to palpation. Rigidity (guarding) - Abdomen is soft. Auscultation Auscultation of the abdomen reveals - Bowel sounds normal.  Musculoskeletal Note: On exam, he is alert and oriented in no apparent distress. His right knee looks excellent. There is no swelling. His range is 0 to 125 with no tenderness or instability. Left knee, slight varus, range 5 to 120, marked crepitus on range of motion. No tenderness or instability.  Assessment & Plan  Status post total right knee replacement [id number]) Primary osteoarthritis of left knee (M17.12)  Note:Surgical Plans: Left Total Knee Replacement  Disposition: Home with Wife  PCP: Dr. Doug Sou  Topical TXA - Colon Cancer  Anesthesia Issues: None  Signed electronically by Joelene Millin, III PA-C

## 2015-02-25 ENCOUNTER — Inpatient Hospital Stay (HOSPITAL_COMMUNITY): Payer: Medicare Other | Admitting: Anesthesiology

## 2015-02-25 ENCOUNTER — Inpatient Hospital Stay (HOSPITAL_COMMUNITY)
Admission: RE | Admit: 2015-02-25 | Discharge: 2015-02-27 | DRG: 470 | Disposition: A | Discharge status: Home Health | Payer: Medicare Other | Source: Ambulatory Visit | Attending: Orthopedic Surgery | Admitting: Orthopedic Surgery

## 2015-02-25 ENCOUNTER — Encounter (HOSPITAL_COMMUNITY): Payer: Self-pay | Admitting: *Deleted

## 2015-02-25 ENCOUNTER — Encounter (HOSPITAL_COMMUNITY)
Admission: RE | Disposition: A | Discharge status: Home Health | Payer: Self-pay | Source: Ambulatory Visit | Attending: Orthopedic Surgery

## 2015-02-25 DIAGNOSIS — E039 Hypothyroidism, unspecified: Secondary | ICD-10-CM | POA: Diagnosis present

## 2015-02-25 DIAGNOSIS — Z01812 Encounter for preprocedural laboratory examination: Secondary | ICD-10-CM | POA: Diagnosis not present

## 2015-02-25 DIAGNOSIS — N183 Chronic kidney disease, stage 3 (moderate): Secondary | ICD-10-CM | POA: Diagnosis present

## 2015-02-25 DIAGNOSIS — Z79899 Other long term (current) drug therapy: Secondary | ICD-10-CM

## 2015-02-25 DIAGNOSIS — Z85828 Personal history of other malignant neoplasm of skin: Secondary | ICD-10-CM

## 2015-02-25 DIAGNOSIS — M1712 Unilateral primary osteoarthritis, left knee: Secondary | ICD-10-CM | POA: Diagnosis present

## 2015-02-25 DIAGNOSIS — Z9049 Acquired absence of other specified parts of digestive tract: Secondary | ICD-10-CM

## 2015-02-25 DIAGNOSIS — Z85038 Personal history of other malignant neoplasm of large intestine: Secondary | ICD-10-CM | POA: Diagnosis not present

## 2015-02-25 DIAGNOSIS — G4730 Sleep apnea, unspecified: Secondary | ICD-10-CM | POA: Diagnosis present

## 2015-02-25 DIAGNOSIS — M25562 Pain in left knee: Secondary | ICD-10-CM | POA: Diagnosis present

## 2015-02-25 DIAGNOSIS — G629 Polyneuropathy, unspecified: Secondary | ICD-10-CM | POA: Diagnosis present

## 2015-02-25 DIAGNOSIS — I129 Hypertensive chronic kidney disease with stage 1 through stage 4 chronic kidney disease, or unspecified chronic kidney disease: Secondary | ICD-10-CM | POA: Diagnosis present

## 2015-02-25 DIAGNOSIS — M1710 Unilateral primary osteoarthritis, unspecified knee: Secondary | ICD-10-CM | POA: Diagnosis present

## 2015-02-25 DIAGNOSIS — M179 Osteoarthritis of knee, unspecified: Secondary | ICD-10-CM | POA: Diagnosis present

## 2015-02-25 HISTORY — PX: TOTAL KNEE ARTHROPLASTY: SHX125

## 2015-02-25 LAB — GLUCOSE, CAPILLARY: Glucose-Capillary: 163 mg/dL — ABNORMAL HIGH (ref 65–99)

## 2015-02-25 SURGERY — ARTHROPLASTY, KNEE, TOTAL
Anesthesia: Monitor Anesthesia Care | Site: Knee | Laterality: Left

## 2015-02-25 MED ORDER — DEXAMETHASONE SODIUM PHOSPHATE 10 MG/ML IJ SOLN
10.0000 mg | Freq: Once | INTRAMUSCULAR | Status: AC
  Administered 2015-02-26: 10 mg via INTRAVENOUS
  Filled 2015-02-25: qty 1

## 2015-02-25 MED ORDER — ACETAMINOPHEN 10 MG/ML IV SOLN
1000.0000 mg | Freq: Once | INTRAVENOUS | Status: AC
  Administered 2015-02-25: 1000 mg via INTRAVENOUS

## 2015-02-25 MED ORDER — MENTHOL 3 MG MT LOZG: 1.0000 | LOZENGE | OROMUCOSAL | Status: DC | PRN

## 2015-02-25 MED ORDER — DEXAMETHASONE SODIUM PHOSPHATE 10 MG/ML IJ SOLN
INTRAMUSCULAR | Status: AC
  Filled 2015-02-25: qty 1

## 2015-02-25 MED ORDER — ONDANSETRON HCL 4 MG/2ML IJ SOLN
INTRAMUSCULAR | Status: DC | PRN
  Administered 2015-02-25: 4 mg via INTRAVENOUS

## 2015-02-25 MED ORDER — TAMSULOSIN HCL 0.4 MG PO CAPS
0.4000 mg | ORAL_CAPSULE | Freq: Every day | ORAL | Status: DC
  Administered 2015-02-25 – 2015-02-26 (×2): 0.4 mg via ORAL
  Filled 2015-02-25 (×3): qty 1

## 2015-02-25 MED ORDER — BUPIVACAINE IN DEXTROSE 0.75-8.25 % IT SOLN
INTRATHECAL | Status: DC | PRN
  Administered 2015-02-25: 1.8 mL via INTRATHECAL

## 2015-02-25 MED ORDER — PROPOFOL 10 MG/ML IV BOLUS
INTRAVENOUS | Status: AC
  Filled 2015-02-25: qty 20

## 2015-02-25 MED ORDER — FENTANYL CITRATE (PF) 100 MCG/2ML IJ SOLN
INTRAMUSCULAR | Status: AC
  Filled 2015-02-25: qty 2

## 2015-02-25 MED ORDER — METHOCARBAMOL 500 MG PO TABS
500.0000 mg | ORAL_TABLET | Freq: Four times a day (QID) | ORAL | Status: DC | PRN
  Administered 2015-02-25 – 2015-02-27 (×3): 500 mg via ORAL
  Filled 2015-02-25 (×3): qty 1

## 2015-02-25 MED ORDER — SODIUM CHLORIDE 0.9 % IV SOLN
2000.0000 mg | INTRAVENOUS | Status: DC | PRN
  Administered 2015-02-25: 2000 mg via TOPICAL

## 2015-02-25 MED ORDER — FENTANYL CITRATE (PF) 100 MCG/2ML IJ SOLN
INTRAMUSCULAR | Status: DC | PRN
  Administered 2015-02-25: 100 ug via INTRAVENOUS

## 2015-02-25 MED ORDER — BUPIVACAINE LIPOSOME 1.3 % IJ SUSP
INTRAMUSCULAR | Status: DC | PRN
  Administered 2015-02-25: 50 mL

## 2015-02-25 MED ORDER — BUPIVACAINE HCL (PF) 0.25 % IJ SOLN
INTRAMUSCULAR | Status: AC
  Filled 2015-02-25: qty 30

## 2015-02-25 MED ORDER — TRANEXAMIC ACID 1000 MG/10ML IV SOLN
2000.0000 mg | Freq: Once | INTRAVENOUS | Status: DC
  Filled 2015-02-25: qty 20

## 2015-02-25 MED ORDER — APIXABAN 2.5 MG PO TABS
2.5000 mg | ORAL_TABLET | Freq: Two times a day (BID) | ORAL | Status: DC
  Administered 2015-02-26 – 2015-02-27 (×3): 2.5 mg via ORAL
  Filled 2015-02-25 (×5): qty 1

## 2015-02-25 MED ORDER — FENTANYL CITRATE (PF) 100 MCG/2ML IJ SOLN
25.0000 ug | INTRAMUSCULAR | Status: DC | PRN
  Administered 2015-02-25: 50 ug via INTRAVENOUS

## 2015-02-25 MED ORDER — BUPIVACAINE LIPOSOME 1.3 % IJ SUSP
20.0000 mL | Freq: Once | INTRAMUSCULAR | Status: DC
  Filled 2015-02-25: qty 20

## 2015-02-25 MED ORDER — SODIUM CHLORIDE 0.9 % IJ SOLN
INTRAMUSCULAR | Status: AC
  Filled 2015-02-25: qty 50

## 2015-02-25 MED ORDER — SODIUM CHLORIDE 0.9 % IV SOLN: INTRAVENOUS | Status: DC

## 2015-02-25 MED ORDER — METOCLOPRAMIDE HCL 5 MG/ML IJ SOLN: 5.0000 mg | Freq: Three times a day (TID) | INTRAMUSCULAR | Status: DC | PRN

## 2015-02-25 MED ORDER — BISACODYL 10 MG RE SUPP: 10.0000 mg | Freq: Every day | RECTAL | Status: DC | PRN

## 2015-02-25 MED ORDER — ACETAMINOPHEN 10 MG/ML IV SOLN
INTRAVENOUS | Status: AC
  Filled 2015-02-25: qty 100

## 2015-02-25 MED ORDER — SODIUM CHLORIDE 0.9 % IJ SOLN
INTRAMUSCULAR | Status: DC | PRN
  Administered 2015-02-25: 30 mL

## 2015-02-25 MED ORDER — LIDOCAINE HCL (CARDIAC) 20 MG/ML IV SOLN
INTRAVENOUS | Status: AC
  Filled 2015-02-25: qty 5

## 2015-02-25 MED ORDER — CEFAZOLIN SODIUM-DEXTROSE 2-3 GM-% IV SOLR
2.0000 g | INTRAVENOUS | Status: AC
  Administered 2015-02-25: 2 g via INTRAVENOUS

## 2015-02-25 MED ORDER — SODIUM CHLORIDE 0.9 % IV SOLN
INTRAVENOUS | Status: DC
  Administered 2015-02-25 – 2015-02-26 (×2): via INTRAVENOUS

## 2015-02-25 MED ORDER — ACETAMINOPHEN 650 MG RE SUPP: 650.0000 mg | Freq: Four times a day (QID) | RECTAL | Status: DC | PRN

## 2015-02-25 MED ORDER — ONDANSETRON HCL 4 MG PO TABS
4.0000 mg | ORAL_TABLET | Freq: Four times a day (QID) | ORAL | Status: DC | PRN
Start: 2015-02-25 — End: 2015-02-27

## 2015-02-25 MED ORDER — LACTATED RINGERS IV SOLN
INTRAVENOUS | Status: DC
  Administered 2015-02-25: 12:00:00 via INTRAVENOUS
  Administered 2015-02-25: 1000 mL via INTRAVENOUS
  Administered 2015-02-25: 11:00:00 via INTRAVENOUS

## 2015-02-25 MED ORDER — DEXAMETHASONE SODIUM PHOSPHATE 10 MG/ML IJ SOLN
10.0000 mg | Freq: Once | INTRAMUSCULAR | Status: AC
  Administered 2015-02-25: 10 mg via INTRAVENOUS

## 2015-02-25 MED ORDER — ONDANSETRON HCL 4 MG/2ML IJ SOLN
INTRAMUSCULAR | Status: AC
  Filled 2015-02-25: qty 2

## 2015-02-25 MED ORDER — MORPHINE SULFATE (PF) 2 MG/ML IV SOLN
1.0000 mg | INTRAVENOUS | Status: DC | PRN
  Administered 2015-02-25 (×3): 1 mg via INTRAVENOUS
  Filled 2015-02-25 (×3): qty 1

## 2015-02-25 MED ORDER — POLYETHYLENE GLYCOL 3350 17 G PO PACK: 17.0000 g | PACK | Freq: Every day | ORAL | Status: DC | PRN

## 2015-02-25 MED ORDER — LIDOCAINE HCL (CARDIAC) 20 MG/ML IV SOLN
INTRAVENOUS | Status: DC | PRN
  Administered 2015-02-25: 50 mg via INTRAVENOUS

## 2015-02-25 MED ORDER — ACETAMINOPHEN 325 MG PO TABS
650.0000 mg | ORAL_TABLET | Freq: Four times a day (QID) | ORAL | Status: DC | PRN
  Administered 2015-02-26: 650 mg via ORAL
  Filled 2015-02-25: qty 2

## 2015-02-25 MED ORDER — LEVOTHYROXINE SODIUM 25 MCG PO TABS
25.0000 ug | ORAL_TABLET | Freq: Every day | ORAL | Status: DC
  Administered 2015-02-26 – 2015-02-27 (×2): 25 ug via ORAL
  Filled 2015-02-25 (×3): qty 1

## 2015-02-25 MED ORDER — METOCLOPRAMIDE HCL 10 MG PO TABS: 5.0000 mg | ORAL_TABLET | Freq: Three times a day (TID) | ORAL | Status: DC | PRN

## 2015-02-25 MED ORDER — ACETAMINOPHEN 500 MG PO TABS
1000.0000 mg | ORAL_TABLET | Freq: Four times a day (QID) | ORAL | Status: AC
  Administered 2015-02-25 – 2015-02-26 (×4): 1000 mg via ORAL
  Filled 2015-02-25 (×5): qty 2

## 2015-02-25 MED ORDER — EPHEDRINE SULFATE 50 MG/ML IJ SOLN
INTRAMUSCULAR | Status: DC | PRN
  Administered 2015-02-25 (×4): 5 mg via INTRAVENOUS

## 2015-02-25 MED ORDER — TRAMADOL HCL 50 MG PO TABS: 50.0000 mg | ORAL_TABLET | Freq: Four times a day (QID) | ORAL | Status: DC | PRN

## 2015-02-25 MED ORDER — FLEET ENEMA 7-19 GM/118ML RE ENEM: 1.0000 | ENEMA | Freq: Once | RECTAL | Status: DC | PRN

## 2015-02-25 MED ORDER — CEFAZOLIN SODIUM-DEXTROSE 2-3 GM-% IV SOLR
2.0000 g | Freq: Four times a day (QID) | INTRAVENOUS | Status: AC
  Administered 2015-02-25 (×2): 2 g via INTRAVENOUS
  Filled 2015-02-25 (×2): qty 50

## 2015-02-25 MED ORDER — DIPHENHYDRAMINE HCL 12.5 MG/5ML PO ELIX: 12.5000 mg | ORAL_SOLUTION | ORAL | Status: DC | PRN

## 2015-02-25 MED ORDER — CEFAZOLIN SODIUM-DEXTROSE 2-3 GM-% IV SOLR
INTRAVENOUS | Status: AC
  Filled 2015-02-25: qty 50

## 2015-02-25 MED ORDER — BUPIVACAINE HCL 0.25 % IJ SOLN
INTRAMUSCULAR | Status: DC | PRN
  Administered 2015-02-25: 20 mL

## 2015-02-25 MED ORDER — OXYCODONE HCL 5 MG PO TABS
5.0000 mg | ORAL_TABLET | ORAL | Status: DC | PRN
  Administered 2015-02-25 – 2015-02-27 (×7): 10 mg via ORAL
  Filled 2015-02-25: qty 1
  Filled 2015-02-25 (×7): qty 2

## 2015-02-25 MED ORDER — DOCUSATE SODIUM 100 MG PO CAPS
100.0000 mg | ORAL_CAPSULE | Freq: Two times a day (BID) | ORAL | Status: DC
  Administered 2015-02-25 – 2015-02-27 (×4): 100 mg via ORAL

## 2015-02-25 MED ORDER — CHLORHEXIDINE GLUCONATE 4 % EX LIQD: 60.0000 mL | Freq: Once | CUTANEOUS | Status: DC

## 2015-02-25 MED ORDER — METHOCARBAMOL 1000 MG/10ML IJ SOLN
500.0000 mg | Freq: Four times a day (QID) | INTRAVENOUS | Status: DC | PRN
  Administered 2015-02-25: 500 mg via INTRAVENOUS
  Filled 2015-02-25 (×2): qty 5

## 2015-02-25 MED ORDER — PHENOL 1.4 % MT LIQD: 1.0000 | OROMUCOSAL | Status: DC | PRN

## 2015-02-25 MED ORDER — PROPOFOL 500 MG/50ML IV EMUL
INTRAVENOUS | Status: DC | PRN
  Administered 2015-02-25: 75 ug/kg/min via INTRAVENOUS

## 2015-02-25 MED ORDER — ONDANSETRON HCL 4 MG/2ML IJ SOLN: 4.0000 mg | Freq: Four times a day (QID) | INTRAMUSCULAR | Status: DC | PRN

## 2015-02-25 SURGICAL SUPPLY — 53 items
BAG DECANTER FOR FLEXI CONT (MISCELLANEOUS) ×3 IMPLANT
BAG ZIPLOCK 12X15 (MISCELLANEOUS) ×6 IMPLANT
BANDAGE ACE 6X5 VEL STRL LF (GAUZE/BANDAGES/DRESSINGS) ×3 IMPLANT
BANDAGE ELASTIC 6 VELCRO ST LF (GAUZE/BANDAGES/DRESSINGS) ×3 IMPLANT
BLADE SAG 18X100X1.27 (BLADE) ×3 IMPLANT
BLADE SAW SGTL 11.0X1.19X90.0M (BLADE) ×3 IMPLANT
BOWL SMART MIX CTS (DISPOSABLE) ×3 IMPLANT
CAP KNEE TOTAL 3 SIGMA ×3 IMPLANT
CEMENT HV SMART SET (Cement) ×6 IMPLANT
CLOSURE STERI-STRIP 1/4X4 (GAUZE/BANDAGES/DRESSINGS) ×3 IMPLANT
CLOSURE WOUND 1/2 X4 (GAUZE/BANDAGES/DRESSINGS) ×2
CLOTH BEACON ORANGE TIMEOUT ST (SAFETY) ×3 IMPLANT
CUFF TOURN SGL QUICK 34 (TOURNIQUET CUFF) ×2
CUFF TRNQT CYL 34X4X40X1 (TOURNIQUET CUFF) ×1 IMPLANT
DECANTER SPIKE VIAL GLASS SM (MISCELLANEOUS) ×3 IMPLANT
DRAPE U-SHAPE 47X51 STRL (DRAPES) ×3 IMPLANT
DRSG ADAPTIC 3X8 NADH LF (GAUZE/BANDAGES/DRESSINGS) ×3 IMPLANT
DRSG PAD ABDOMINAL 8X10 ST (GAUZE/BANDAGES/DRESSINGS) ×3 IMPLANT
DURAPREP 26ML APPLICATOR (WOUND CARE) ×3 IMPLANT
ELECT REM PT RETURN 9FT ADLT (ELECTROSURGICAL) ×3
ELECTRODE REM PT RTRN 9FT ADLT (ELECTROSURGICAL) ×1 IMPLANT
EVACUATOR 1/8 PVC DRAIN (DRAIN) ×3 IMPLANT
GAUZE SPONGE 4X4 12PLY STRL (GAUZE/BANDAGES/DRESSINGS) ×3 IMPLANT
GLOVE BIO SURGEON STRL SZ7.5 (GLOVE) ×15 IMPLANT
GLOVE BIO SURGEON STRL SZ8 (GLOVE) ×3 IMPLANT
GLOVE BIOGEL PI IND STRL 6.5 (GLOVE) IMPLANT
GLOVE BIOGEL PI IND STRL 8 (GLOVE) ×1 IMPLANT
GLOVE BIOGEL PI INDICATOR 6.5 (GLOVE)
GLOVE BIOGEL PI INDICATOR 8 (GLOVE) ×2
GLOVE SURG SS PI 6.5 STRL IVOR (GLOVE) IMPLANT
GOWN STRL REUS W/TWL LRG LVL3 (GOWN DISPOSABLE) ×12 IMPLANT
GOWN STRL REUS W/TWL XL LVL3 (GOWN DISPOSABLE) IMPLANT
HANDPIECE INTERPULSE COAX TIP (DISPOSABLE) ×2
IMMOBILIZER KNEE 20 (SOFTGOODS) ×3
IMMOBILIZER KNEE 20 THIGH 36 (SOFTGOODS) ×1 IMPLANT
MANIFOLD NEPTUNE II (INSTRUMENTS) ×3 IMPLANT
NS IRRIG 1000ML POUR BTL (IV SOLUTION) ×3 IMPLANT
PACK TOTAL KNEE CUSTOM (KITS) ×3 IMPLANT
PAD ABD 8X10 STRL (GAUZE/BANDAGES/DRESSINGS) ×3 IMPLANT
PADDING CAST COTTON 6X4 STRL (CAST SUPPLIES) ×6 IMPLANT
POSITIONER SURGICAL ARM (MISCELLANEOUS) ×3 IMPLANT
SET HNDPC FAN SPRY TIP SCT (DISPOSABLE) ×1 IMPLANT
STRIP CLOSURE SKIN 1/2X4 (GAUZE/BANDAGES/DRESSINGS) ×4 IMPLANT
SUT MNCRL AB 4-0 PS2 18 (SUTURE) ×3 IMPLANT
SUT VIC AB 2-0 CT1 27 (SUTURE) ×6
SUT VIC AB 2-0 CT1 TAPERPNT 27 (SUTURE) ×3 IMPLANT
SUT VLOC 180 0 24IN GS25 (SUTURE) ×3 IMPLANT
SYR 50ML LL SCALE MARK (SYRINGE) ×3 IMPLANT
TRAY FOLEY W/METER SILVER 14FR (SET/KITS/TRAYS/PACK) IMPLANT
TRAY FOLEY W/METER SILVER 16FR (SET/KITS/TRAYS/PACK) ×3 IMPLANT
WATER STERILE IRR 1500ML POUR (IV SOLUTION) ×3 IMPLANT
WRAP KNEE MAXI GEL POST OP (GAUZE/BANDAGES/DRESSINGS) ×3 IMPLANT
YANKAUER SUCT BULB TIP 10FT TU (MISCELLANEOUS) ×3 IMPLANT

## 2015-02-25 NOTE — Transfer of Care (Signed)
Immediate Anesthesia Transfer of Care Note  Patient: Lawrence Wilson  Procedure(s) Performed: Procedure(s): LEFT TOTAL KNEE ARTHROPLASTY (Left)  Patient Location: PACU  Anesthesia Type:Regional  Level of Consciousness: awake, alert  and oriented  Airway & Oxygen Therapy: Patient Spontanous Breathing and Patient connected to face mask oxygen  Post-op Assessment: Report given to RN and Post -op Vital signs reviewed and stable  Post vital signs: Reviewed and stable  Last Vitals:  Filed Vitals:   02/25/15 0737  BP: 153/71  Pulse: 79  Temp: 36.8 C  Resp: 18    Complications: No apparent anesthesia complications

## 2015-02-25 NOTE — Anesthesia Procedure Notes (Signed)
Spinal Patient location during procedure: OR End time: 02/25/2015 10:07 AM Staffing Resident/CRNA: Noralyn Pick D Performed by: anesthesiologist  Preanesthetic Checklist Completed: patient identified, site marked, surgical consent, pre-op evaluation, timeout performed, IV checked, risks and benefits discussed and monitors and equipment checked Spinal Block Patient position: sitting Prep: Betadine Patient monitoring: heart rate, continuous pulse ox and blood pressure Approach: midline Location: L3-4 Injection technique: single-shot Needle Needle type: Sprotte  Needle gauge: 24 G Needle length: 9 cm Assessment Sensory level: T6 Additional Notes Expiration date of kit checked and confirmed. Patient tolerated procedure well, without complications.

## 2015-02-25 NOTE — Anesthesia Postprocedure Evaluation (Signed)
Anesthesia Post Note  Patient: Bayne Buehrer  Procedure(s) Performed: Procedure(s) (LRB): LEFT TOTAL KNEE ARTHROPLASTY (Left)  Patient location during evaluation: PACU Anesthesia Type: Spinal and MAC Level of consciousness: awake and alert Pain management: pain level controlled Vital Signs Assessment: post-procedure vital signs reviewed and stable Respiratory status: spontaneous breathing, respiratory function stable and patient connected to nasal cannula oxygen Cardiovascular status: blood pressure returned to baseline and stable Postop Assessment: spinal receding Anesthetic complications: no    Last Vitals:  Filed Vitals:   02/25/15 1245 02/25/15 1259  BP: 157/71 160/76  Pulse: 67 70  Temp:  36.9 C  Resp: 16 16    Last Pain:  Filed Vitals:   02/25/15 1300  PainSc: 2                  Jala Dundon,W. EDMOND

## 2015-02-25 NOTE — Anesthesia Preprocedure Evaluation (Addendum)
Anesthesia Evaluation  Patient identified by MRN, date of birth, ID band Patient awake    Reviewed: Allergy & Precautions, H&P , NPO status , Patient's Chart, lab work & pertinent test results  Airway Mallampati: III  TM Distance: >3 FB Neck ROM: Full    Dental no notable dental hx. (+) Teeth Intact, Dental Advisory Given   Pulmonary sleep apnea and Continuous Positive Airway Pressure Ventilation ,    Pulmonary exam normal breath sounds clear to auscultation       Cardiovascular hypertension, Pt. on medications  Rhythm:Regular Rate:Normal     Neuro/Psych negative neurological ROS  negative psych ROS   GI/Hepatic Neg liver ROS, GERD  Medicated and Controlled,  Endo/Other  Hypothyroidism   Renal/GU negative Renal ROS  negative genitourinary   Musculoskeletal  (+) Arthritis , Osteoarthritis,    Abdominal   Peds  Hematology negative hematology ROS (+)   Anesthesia Other Findings   Reproductive/Obstetrics negative OB ROS                            Anesthesia Physical Anesthesia Plan  ASA: III  Anesthesia Plan: MAC and Spinal   Post-op Pain Management:    Induction: Intravenous  Airway Management Planned: Simple Face Mask  Additional Equipment:   Intra-op Plan:   Post-operative Plan:   Informed Consent: I have reviewed the patients History and Physical, chart, labs and discussed the procedure including the risks, benefits and alternatives for the proposed anesthesia with the patient or authorized representative who has indicated his/her understanding and acceptance.   Dental advisory given  Plan Discussed with: CRNA  Anesthesia Plan Comments:         Anesthesia Quick Evaluation

## 2015-02-25 NOTE — Interval H&P Note (Signed)
History and Physical Interval Note:  02/25/2015 9:15 AM  Lawrence Wilson  has presented today for surgery, with the diagnosis of left knee osteoarthritis  The various methods of treatment have been discussed with the patient and family. After consideration of risks, benefits and other options for treatment, the patient has consented to  Procedure(s): LEFT TOTAL KNEE ARTHROPLASTY (Left) as a surgical intervention .  The patient's history has been reviewed, patient examined, no change in status, stable for surgery.  I have reviewed the patient's chart and labs.  Questions were answered to the patient's satisfaction.     Gearlean Alf

## 2015-02-25 NOTE — Evaluation (Signed)
Physical Therapy Evaluation Patient Details Name: Lawrence Wilson MRN: [id number] DOB: Nov 02, 1929 Today's Date: 02/25/2015   History of Present Illness  pt s/p LTKA with hisorty of RTKA last year in January.   Clinical Impression  Pt is s/p L TKA resulting in the deficits listed below (see PT Problem List). Pt will benefit from skilled PT to increase their independence and safety with mobility to allow discharge to the venue listed below.      Follow Up Recommendations Home health PT    Equipment Recommendations  None recommended by PT    Recommendations for Other Services       Precautions / Restrictions Precautions Precautions: Knee Required Braces or Orthoses: Knee Immobilizer - Left Restrictions Weight Bearing Restrictions: No      Mobility  Bed Mobility Overal bed mobility: Needs Assistance Bed Mobility: Supine to Sit;Sit to Supine     Supine to sit: Min assist     General bed mobility comments: with LEs and use of rail for upper body  Transfers Overall transfer level: Needs assistance Equipment used: Rolling walker (2 wheeled) Transfers: Sit to/from Stand Sit to Stand: Min guard         General transfer comment: cues for hand placement and sequencing   Ambulation/Gait Ambulation/Gait assistance: Min guard Ambulation Distance (Feet): 25 Feet Assistive device: Rolling walker (2 wheeled) Gait Pattern/deviations: Step-to pattern     General Gait Details: cues for sequecing and safety to not get to close to front of walker  Stairs            Wheelchair Mobility    Modified Rankin (Stroke Patients Only)       Balance                                             Pertinent Vitals/Pain Pain Assessment: 0-10 Pain Score: 5  Pain Location: L kknee area Pain Descriptors / Indicators: Aching Pain Intervention(s): Limited activity within patient's tolerance;Monitored during session;Premedicated before session;Ice applied    Home  Living Family/patient expects to be discharged to:: Private residence (independent cottage at [employer] ) Living Arrangements: Spouse/significant other Available Help at Discharge: Family Type of Home: Independent living facility (independent cottage at [employer] ) Home Access: Level entry     Home Layout: One level        Prior Function                 Hand Dominance        Extremity/Trunk Assessment               Lower Extremity Assessment: LLE deficits/detail   LLE Deficits / Details: Independet with SLR today , so quad nicely waking up, and knee flexion AAROM grossly 0-50 degrees     Communication   Communication: No difficulties  Cognition Arousal/Alertness: Awake/alert Behavior During Therapy: WFL for tasks assessed/performed Overall Cognitive Status: Within Functional Limits for tasks assessed                      General Comments      Exercises Total Joint Exercises Ankle Circles/Pumps: Left;AROM;5 reps;Supine Quad Sets: AROM;Left;5 reps;Supine Heel Slides: AAROM;Supine;Left;5 reps Straight Leg Raises: AAROM;Supine;Left;5 reps Goniometric ROM: 0-50      Assessment/Plan    PT Assessment Patient needs continued PT services  PT Diagnosis Difficulty walking   PT Problem List Decreased  range of motion;Decreased strength;Decreased activity tolerance;Decreased knowledge of use of DME;Decreased mobility  PT Treatment Interventions DME instruction;Gait training;Functional mobility training;Therapeutic activities;Therapeutic exercise;Patient/family education   PT Goals (Current goals can be found in the Care Plan section) Acute Rehab PT Goals Patient Stated Goal: To head home in few days PT Goal Formulation: With patient Time For Goal Achievement: 03/11/15 Potential to Achieve Goals: Good    Frequency 7X/week   Barriers to discharge        Co-evaluation               End of Session Equipment Utilized During Treatment: Gait  belt Activity Tolerance: Patient tolerated treatment well Patient left: in chair;with call bell/phone within reach;with chair alarm set Nurse Communication: Mobility status         Time: [id number] PT Time Calculation (min) (ACUTE ONLY): 22 min   Charges:   PT Evaluation $PT Eval Low Complexity: 1 Procedure     PT G CodesClide Dales 03-02-15, 6:11 PM  Clide Dales, PT Pager: [phone] Mar 02, 2015

## 2015-02-25 NOTE — H&P (View-Only) (Signed)
Modesto Charon DOB: [birth year] Married / Language: English / Race: White Male Date of Admission:  02/25/2015 CC:  Left Knee Pain History of Present Illness  The patient is a 82 year old male who comes in for a preoperative History and Physical. The patient is scheduled for a left total knee arthroplasty to be performed by Dr. [name]. [name], MD at [facility] on 02-25-2015. The patient is a 82 year old male who presented with bilateral knee complaints. The patient was initially seen for a second opinion (for right greater than left knee pain). The patient reported left knee and right knee symptoms including: pain, weakness, stiffness and soreness. The patient has the current diagnosis of knee osteoarthritis. Prior to being seen in the practice, the patient was previously evaluated by a colleague (Dr. [name]). Previous work-up for this problem has included knee x-rays. Past treatment for this problem has included intra-articular injection of corticosteroids. He has had more trouble with the knees in the past 2 years, right greater than left. He has since had the right knee repaced about a year ago and has rehabed it well. He said his right knee is doing really well at this time. He denies groin pain. No injuries to the knees. With regards to the the left knee, he typically has pain with weightbearing, not at rest. He is to the point now that he would like to move forward with the left side at this time. The patient states that he is doing very well with the right total knee at this time, however, the left knee continues to hold him back. He is now ready to proceed with sugery on the left knee at this time. They have been treated conservatively in the past for the above stated problem and despite conservative measures, they continue to have progressive pain and severe functional limitations and dysfunction. They have failed non-operative management including home exercise, medications, and  injections. It is felt that they would benefit from undergoing total joint replacement. Risks and benefits of the procedure have been discussed with the patient and they elect to proceed with surgery. There are no active contraindications to surgery such as ongoing infection or rapidly progressive neurological disease.  Problem List/Past Medical Chronic lymphocytic leukemia (C91.10)  Status post total right knee replacement [id number])  Primary osteoarthritis of left knee (M17.12)  Malignant Neoplasm of the Skin  Herpes Zoster  Myopia Both Eyes  Impaired Memory  Hypothyroidism  Chronic kidney disease  Stage III High blood pressure  Peripheral Neuropathy  Skin Cancer  Sleep Apnea  uses CPAP Shingles  Measles  Mumps  Colon Cancer  Benign Neoplasm of Colon  Chronic Rhinitis  Contact Dermatitis  Cataract  Bilateral Enlarged Prostate  Allergies No Known Drug Allergies  Family History Heart Disease  Brother, Father. Cancer  Mother, Sister. Congestive Heart Failure  Father. Hypertension  Mother.  Social History No history of drug/alcohol rehab  Not under pain contract  Number of flights of stairs before winded  greater than 5 [facility] [place] situation  live with spouse Marital status  married Exercise  Exercises weekly; does other and gym / weights Current work status  retired Current drinker  10/20/2013: Currently drinks beer only occasionally per week Tobacco use  Never smoker. 10/20/2013 Post-Surgical Plans  Home with his wife following the Left Total Knee Repalcement on 02/25/2015. He is a resident of [place].  Medication History  Fish Oil Active. Multivitamin (Oral) Active.  Aleve (220MG  Tablet, Oral) Active. Levothyroxine Sodium (25MCG Tablet, Oral) Active. Lisinopril-Hydrochlorothiazide (20-12.5MG  Tablet, Oral) Active. Tamsulosin HCl (0.4MG  Capsule, Oral) Active.  Past Surgical  History Inguinal Hernia Repair  Date: 1980. open: left Colectomy  Date: 2010. partial Adnoidectomy  Colon Polyp Removal - Colonoscopy  Tonsillectomy  Colon Polyp Removal - Open  Total Knee Replacement - Right  Date: 02/2014.   Review of Systems General Not Present- Chills, Fatigue, Fever, Memory Loss, Night Sweats, Weight Gain and Weight Loss. Skin Not Present- Eczema, Hives, Itching, Lesions and Rash. HEENT Not Present- Dentures, Double Vision, Headache, Hearing Loss, Tinnitus and Visual Loss. Respiratory Not Present- Allergies, Chronic Cough, Coughing up blood, Shortness of breath at rest and Shortness of breath with exertion. Cardiovascular Not Present- Chest Pain, Difficulty Breathing Lying Down, Murmur, Palpitations, Racing/skipping heartbeats and Swelling. Gastrointestinal Not Present- Abdominal Pain, Bloody Stool, Constipation, Diarrhea, Difficulty Swallowing, Heartburn, Jaundice, Loss of appetitie, Nausea and Vomiting. Male Genitourinary Present- Urinary frequency, Urinating at Night and Weak urinary stream. Not Present- Blood in Urine, Discharge, Flank Pain, Incontinence, Painful Urination, Urgency and Urinary Retention. Musculoskeletal Present- Joint Pain and Morning Stiffness. Not Present- Back Pain, Joint Swelling, Muscle Pain, Muscle Weakness and Spasms. Neurological Not Present- Blackout spells, Difficulty with balance, Dizziness, Paralysis, Tremor and Weakness. Psychiatric Not Present- Insomnia.  Vitals Weight: 202 lb Height: 69in Weight was reported by patient. Height was reported by patient. Body Surface Area: 2.07 m Body Mass Index: 29.83 kg/m  BP: 142/68 (Sitting, Right Arm, Standard)  Physical Exam General Mental Status -Alert, cooperative and good historian. General Appearance-pleasant, Not in acute distress. Orientation-Oriented X3. Build & Nutrition-Well nourished and Well developed.  Head and Neck Head-normocephalic, atraumatic  . Neck Global Assessment - supple, no bruit auscultated on the right, no bruit auscultated on the left.  Eye Pupil - Bilateral-Regular and Round. Motion - Bilateral-EOMI.  Chest and Lung Exam Auscultation Breath sounds - clear at anterior chest wall and clear at posterior chest wall. Adventitious sounds - No Adventitious sounds.  Cardiovascular Auscultation Rhythm - Regular rate and rhythm. Heart Sounds - S1 WNL and S2 WNL. Murmurs & Other Heart Sounds - Auscultation of the heart reveals - No Murmurs.  Abdomen Inspection Contour - Generalized mild distention. Palpation/Percussion Tenderness - Abdomen is non-tender to palpation. Rigidity (guarding) - Abdomen is soft. Auscultation Auscultation of the abdomen reveals - Bowel sounds normal.  Musculoskeletal Note: On exam, he is alert and oriented in no apparent distress. His right knee looks excellent. There is no swelling. His range is 0 to 125 with no tenderness or instability. Left knee, slight varus, range 5 to 120, marked crepitus on range of motion. No tenderness or instability.  Assessment & Plan  Status post total right knee replacement [id number]) Primary osteoarthritis of left knee (M17.12)  Note:Surgical Plans: Left Total Knee Replacement  Disposition: Home with Wife  PCP: Dr. Doug Sou  Topical TXA - Colon Cancer  Anesthesia Issues: None  Signed electronically by Joelene Millin, III PA-C

## 2015-02-25 NOTE — Progress Notes (Signed)
Utilization review completed.  

## 2015-02-25 NOTE — Progress Notes (Signed)
Patient will self administer when ready and RN will change the oxygen over to the CPAP machine. RN will call if any problems.

## 2015-02-25 NOTE — Op Note (Signed)
Pre-operative diagnosis- Osteoarthritis  Left knee(s)  Post-operative diagnosis- Osteoarthritis Left knee(s)  Procedure-  Left  Total Knee Arthroplasty  Surgeon- Lawrence Plover. Rajon Bisig, MD  Assistant- Arlee Muslim, PA-C   Anesthesia-  Spinal  EBL-* No blood loss amount entered *   Drains Hemovac  Tourniquet time- 36 minutes @ [id number] mm Hg    Complications- None  Condition-PACU - hemodynamically stable.   Brief Clinical Note   Lawrence Wilson is a 82 y.o. year old male with end stage OA of his left knee with progressively worsening pain and dysfunction. He has constant pain, with activity and at rest and significant functional deficits with difficulties even with ADLs. He has had extensive non-op management including analgesics, injections of cortisone and viscosupplements, and home exercise program, but remains in significant pain with significant dysfunction. Radiographs show bone on bone arthritis medial and patellofemoral. He presents now for left Total Knee Arthroplasty.     Procedure in detail---   The patient is brought into the operating room and positioned supine on the operating table. After successful administration of  Spinal,   a tourniquet is placed high on the  Left thigh(s) and the lower extremity is prepped and draped in the usual sterile fashion. Time out is performed by the operating team and then the  Left lower extremity is wrapped in Esmarch, knee flexed and the tourniquet inflated to 300 mmHg.       A midline incision is made with a ten blade through the subcutaneous tissue to the level of the extensor mechanism. A fresh blade is used to make a medial parapatellar arthrotomy. Soft tissue over the proximal medial tibia is subperiosteally elevated to the joint line with a knife and into the semimembranosus bursa with a Cobb elevator. Soft tissue over the proximal lateral tibia is elevated with attention being paid to avoiding the patellar tendon on the tibial tubercle. The patella  is everted, knee flexed 90 degrees and the ACL and PCL are removed. Findings are bone on bone medial and patellofemoral with large global ostoephytes.        The drill is used to create a starting hole in the distal femur and the canal is thoroughly irrigated with sterile saline to remove the fatty contents. The 5 degree Left  valgus alignment guide is placed into the femoral canal and the distal femoral cutting block is pinned to remove 10 mm off the distal femur. Resection is made with an oscillating saw.      The tibia is subluxed forward and the menisci are removed. The extramedullary alignment guide is placed referencing proximally at the medial aspect of the tibial tubercle and distally along the second metatarsal axis and tibial crest. The block is pinned to remove 80mm off the more deficient medial   side. Resection is made with an oscillating saw. Size 4is the most appropriate size for the tibia and the proximal tibia is prepared with the modular drill and keel punch for that size.      The femoral sizing guide is placed and size 4 is most appropriate. Rotation is marked off the epicondylar axis and confirmed by creating a rectangular flexion gap at 90 degrees. The size 4 cutting block is pinned in this rotation and the anterior, posterior and chamfer cuts are made with the oscillating saw. The intercondylar block is then placed and that cut is made.      Trial size 4 tibial component, trial size 4 posterior stabilized femur and a 15  mm posterior stabilized rotating platform insert trial is placed. Full extension is achieved with excellent varus/valgus and anterior/posterior balance throughout full range of motion. The patella is everted and thickness measured to be 27  mm. Free hand resection is taken to 15 mm, a 41 template is placed, lug holes are drilled, trial patella is placed, and it tracks normally. Osteophytes are removed off the posterior femur with the trial in place. All trials are removed  and the cut bone surfaces prepared with pulsatile lavage. Cement is mixed and once ready for implantation, the size 4 tibial implant, size  4 posterior stabilized femoral component, and the size 41 patella are cemented in place and the patella is held with the clamp. The trial insert is placed and the knee held in full extension. The Exparel (20 ml mixed with 30 ml saline) and .25% Bupivicaine, are injected into the extensor mechanism, posterior capsule, medial and lateral gutters and subcutaneous tissues.  All extruded cement is removed and once the cement is hard the permanent 15 mm posterior stabilized rotating platform insert is placed into the tibial tray.      The wound is copiously irrigated with saline solution and the extensor mechanism closed over a hemovac drain with #1 V-loc suture. The tourniquet is released for a total tourniquet time of 38  minutes. Flexion against gravity is 140 degrees and the patella tracks normally. Subcutaneous tissue is closed with 2.0 vicryl and subcuticular with running 4.0 Monocryl. The incision is cleaned and dried and steri-strips and a bulky sterile dressing are applied. The limb is placed into a knee immobilizer and the patient is awakened and transported to recovery in stable condition.      Please note that a surgical assistant was a medical necessity for this procedure in order to perform it in a safe and expeditious manner. Surgical assistant was necessary to retract the ligaments and vital neurovascular structures to prevent injury to them and also necessary for proper positioning of the limb to allow for anatomic placement of the prosthesis.   Lawrence Plover Mckaylin Bastien, MD    02/25/2015, 11:08 AM

## 2015-02-25 NOTE — Progress Notes (Signed)
Report given to Otelia Sergeant, R.N.- for continued PACU care

## 2015-02-26 LAB — BASIC METABOLIC PANEL
ANION GAP: 6 (ref 5–15)
BUN: 24 mg/dL — ABNORMAL HIGH (ref 6–20)
CHLORIDE: 110 mmol/L (ref 101–111)
CO2: 26 mmol/L (ref 22–32)
CREATININE: 1.08 mg/dL (ref 0.61–1.24)
Calcium: 8.1 mg/dL — ABNORMAL LOW (ref 8.9–10.3)
GFR calc non Af Amer: >60 mL/min (ref >60)
GLUCOSE: 158 mg/dL — AB (ref 65–99)
Potassium: 4.5 mmol/L (ref 3.5–5.1)
Sodium: 142 mmol/L (ref 135–145)

## 2015-02-26 LAB — CBC
HEMATOCRIT: 33.4 % — AB (ref 39.0–52.0)
HEMOGLOBIN: 10.8 g/dL — AB (ref 13.0–17.0)
MCH: 30.9 pg (ref 26.0–34.0)
MCHC: 32.3 g/dL (ref 30.0–36.0)
MCV: 95.4 fL (ref 78.0–100.0)
Platelets: 185 10*3/uL (ref 150–400)
RBC: 3.5 MIL/uL — ABNORMAL LOW (ref 4.22–5.81)
RDW: 14 % (ref 11.5–15.5)
WBC: 15.8 10*3/uL — ABNORMAL HIGH (ref 4.0–10.5)

## 2015-02-26 MED ORDER — OXYCODONE HCL 5 MG PO TABS: 5.0000 mg | ORAL_TABLET | ORAL | Status: DC | PRN

## 2015-02-26 MED ORDER — TRAMADOL HCL 50 MG PO TABS: 50.0000 mg | ORAL_TABLET | Freq: Four times a day (QID) | ORAL | Status: DC | PRN

## 2015-02-26 MED ORDER — METHOCARBAMOL 500 MG PO TABS: 500.0000 mg | ORAL_TABLET | Freq: Four times a day (QID) | ORAL | Status: DC | PRN

## 2015-02-26 MED ORDER — APIXABAN 2.5 MG PO TABS: 2.5000 mg | ORAL_TABLET | Freq: Two times a day (BID) | ORAL | Status: DC

## 2015-02-26 NOTE — Progress Notes (Signed)
Physical Therapy Treatment Patient Details Name: Lawrence Wilson MRN: [id number] DOB: [birth year] Today's Date: 02/26/2015    History of Present Illness pt s/p LTKA with hisorty of RTKA last year in January.     PT Comments    POD # 1 am session.  OOB in recliner.  Pt able to perform active SLR so D/C KI.  Performed all supine TKR TE's then assisted with amb in hallway.  Applied ICE.    Follow Up Recommendations  Home health PT     Equipment Recommendations  None recommended by PT    Recommendations for Other Services       Precautions / Restrictions Precautions Precautions: Knee Precaution Comments: Pt able to perform active SLR so D/C KI Restrictions Weight Bearing Restrictions: No Other Position/Activity Restrictions: WBAT    Mobility  Bed Mobility               General bed mobility comments: OOB in recliner  Transfers Overall transfer level: Needs assistance Equipment used: Rolling walker (2 wheeled) Transfers: Sit to/from Stand Sit to Stand: Min guard         General transfer comment: one VC on safety with turns  Ambulation/Gait Ambulation/Gait assistance: Supervision;Min guard Ambulation Distance (Feet): 65 Feet Assistive device: Rolling walker (2 wheeled) Gait Pattern/deviations: Step-to pattern;Trunk flexed;Decreased stance time - left Gait velocity: decreased   General Gait Details: one VC on proper walker to self distance and upright posture   Stairs            Wheelchair Mobility    Modified Rankin (Stroke Patients Only)       Balance                                    Cognition Arousal/Alertness: Awake/alert Behavior During Therapy: WFL for tasks assessed/performed Overall Cognitive Status: Within Functional Limits for tasks assessed                      Exercises   Total Knee Replacement TE's 10 reps B LE ankle pumps 10 reps towel squeezes 10 reps knee presses 10 reps heel slides  10 reps  SAQ's 10 reps SLR's 10 reps ABD Followed by ICE     General Comments        Pertinent Vitals/Pain Pain Assessment: 0-10 Pain Score: 3  Pain Location: L knee Pain Descriptors / Indicators: Aching;Sore Pain Intervention(s): Monitored during session;Premedicated before session;Repositioned;Ice applied    Home Living                      Prior Function            PT Goals (current goals can now be found in the care plan section) Progress towards PT goals: Progressing toward goals    Frequency  7X/week    PT Plan Current plan remains appropriate    Co-evaluation             End of Session Equipment Utilized During Treatment: Gait belt Activity Tolerance: Patient tolerated treatment well Patient left: in chair;with call bell/phone within reach;with chair alarm set     Time: 1135-1200 PT Time Calculation (min) (ACUTE ONLY): 25 min  Charges:  $Gait Training: 8-22 mins $Therapeutic Exercise: 8-22 mins                    G Codes:      Cecille Rubin  Arsema Tusing  PTA [facility]  Acute  Rehab Pager      [phone]

## 2015-02-26 NOTE — Addendum Note (Signed)
Addendum  created 02/26/15 0720 by Lollie Sails, CRNA   Modules edited: Charges VN

## 2015-02-26 NOTE — Discharge Instructions (Addendum)
° °Dr. Frank [name] °Total Joint Specialist °[facility] °[address] °[place], [place] [address] °[phone] ° °TOTAL KNEE REPLACEMENT POSTOPERATIVE DIRECTIONS ° °Knee Rehabilitation, Guidelines Following Surgery  °Results after knee surgery are often greatly improved when you follow the exercise, range of motion and muscle strengthening exercises prescribed by your doctor. Safety measures are also important to protect the knee from further injury. Any time any of these exercises cause you to have increased pain or swelling in your knee joint, decrease the amount until you are comfortable again and slowly increase them. If you have problems or questions, call your caregiver or physical therapist for advice.  ° °HOME CARE INSTRUCTIONS  °Remove items at home which could result in a fall. This includes throw rugs or furniture in walking pathways.  °· ICE to the affected knee every three hours for 30 minutes at a time and then as needed for pain and swelling.  Continue to use ice on the knee for pain and swelling from surgery. You may notice swelling that will progress down to the foot and ankle.  This is normal after surgery.  Elevate the leg when you are not up walking on it.   °· Continue to use the breathing machine which will help keep your temperature down.  It is common for your temperature to cycle up and down following surgery, especially at night when you are not up moving around and exerting yourself.  The breathing machine keeps your lungs expanded and your temperature down. °· Do not place pillow under knee, focus on keeping the knee straight while resting ° °DIET °You may resume your previous home diet once your are discharged from the hospital. ° °DRESSING / WOUND CARE / SHOWERING °You may shower 3 days after surgery, but keep the wounds dry during showering.  You may use an occlusive plastic wrap (Press'n Seal for example), NO SOAKING/SUBMERGING IN THE BATHTUB.  If the  bandage gets wet, change with a clean dry gauze.  If the incision gets wet, pat the wound dry with a clean towel. °You may start showering once you are discharged home but do not submerge the incision under water. Just pat the incision dry and apply a dry gauze dressing on daily. °Change the surgical dressing daily and reapply a dry dressing each time. ° °ACTIVITY °Walk with your walker as instructed. °Use walker as long as suggested by your caregivers. °Avoid periods of inactivity such as sitting longer than an hour when not asleep. This helps prevent blood clots.  °You may resume a sexual relationship in one month or when given the OK by your doctor.  °You may return to work once you are cleared by your doctor.  °Do not drive a car for 6 weeks or until released by you surgeon.  °Do not drive while taking narcotics. ° °WEIGHT BEARING °Weight bearing as tolerated with assist device (walker, cane, etc) as directed, use it as long as suggested by your surgeon or therapist, typically at least 4-6 weeks. ° °POSTOPERATIVE CONSTIPATION PROTOCOL °Constipation - defined medically as fewer than three stools per week and severe constipation as less than one stool per week. ° °One of the most common issues patients have following surgery is constipation.  Even if you have a regular bowel pattern at home, your normal regimen is likely to be disrupted due to multiple reasons following surgery.  Combination of anesthesia, postoperative narcotics, change in appetite and fluid intake all can affect your bowels.    In order to avoid complications following surgery, here are some recommendations in order to help you during your recovery period. ° °Colace (docusate) - Pick up an over-the-counter form of Colace or another stool softener and take twice a day as long as you are requiring postoperative pain medications.  Take with a full glass of water daily.  If you experience loose stools or diarrhea, hold the colace until you stool forms  back up.  If your symptoms do not get better within 1 week or if they get worse, check with your doctor. ° °Dulcolax (bisacodyl) - Pick up over-the-counter and take as directed by the product packaging as needed to assist with the movement of your bowels.  Take with a full glass of water.  Use this product as needed if not relieved by Colace only.  ° °MiraLax (polyethylene glycol) - Pick up over-the-counter to have on hand.  MiraLax is a solution that will increase the amount of water in your bowels to assist with bowel movements.  Take as directed and can mix with a glass of water, juice, soda, coffee, or tea.  Take if you go more than two days without a movement. °Do not use MiraLax more than once per day. Call your doctor if you are still constipated or irregular after using this medication for 7 days in a row. ° °If you continue to have problems with postoperative constipation, please contact the office for further assistance and recommendations.  If you experience "the worst abdominal pain ever" or develop nausea or vomiting, please contact the office immediatly for further recommendations for treatment. ° °ITCHING ° If you experience itching with your medications, try taking only a single pain pill, or even half a pain pill at a time.  You can also use Benadryl over the counter for itching or also to help with sleep.  ° °TED HOSE STOCKINGS °Wear the elastic stockings on both legs for three weeks following surgery during the day but you may remove then at night for sleeping. ° °MEDICATIONS °See your medication summary on the “After Visit Summary” that the nursing staff will review with you prior to discharge.  You may have some home medications which will be placed on hold until you complete the course of blood thinner medication.  It is important for you to complete the blood thinner medication as prescribed by your surgeon.  Continue your approved medications as instructed at time of  discharge. ° °PRECAUTIONS °If you experience chest pain or shortness of breath - call 911 immediately for transfer to the hospital emergency department.  °If you develop a fever greater that 101 F, purulent drainage from wound, increased redness or drainage from wound, foul odor from the wound/dressing, or calf pain - CONTACT YOUR SURGEON.   °                                                °FOLLOW-UP APPOINTMENTS °Make sure you keep all of your appointments after your operation with your surgeon and caregivers. You should call the office at the above phone number and make an appointment for approximately two weeks after the date of your surgery or on the date instructed by your surgeon outlined in the "After Visit Summary". ° ° °RANGE OF MOTION AND STRENGTHENING EXERCISES  °Rehabilitation of the knee is important following a knee injury or   an operation. After just a few days of immobilization, the muscles of the thigh which control the knee become weakened and shrink (atrophy). Knee exercises are designed to build up the tone and strength of the thigh muscles and to improve knee motion. Often times heat used for twenty to thirty minutes before working out will loosen up your tissues and help with improving the range of motion but do not use heat for the first two weeks following surgery. These exercises can be done on a training (exercise) mat, on the floor, on a table or on a bed. Use what ever works the best and is most comfortable for you Knee exercises include:  °Leg Lifts - While your knee is still immobilized in a splint or cast, you can do straight leg raises. Lift the leg to 60 degrees, hold for 3 sec, and slowly lower the leg. Repeat 10-20 times 2-3 times daily. Perform this exercise against resistance later as your knee gets better.  °Quad and Hamstring Sets - Tighten up the muscle on the front of the thigh (Quad) and hold for 5-10 sec. Repeat this 10-20 times hourly. Hamstring sets are done by pushing the  foot backward against an object and holding for 5-10 sec. Repeat as with quad sets.  °· Leg Slides: Lying on your back, slowly slide your foot toward your buttocks, bending your knee up off the floor (only go as far as is comfortable). Then slowly slide your foot back down until your leg is flat on the floor again. °· Angel Wings: Lying on your back spread your legs to the side as far apart as you can without causing discomfort.  °A rehabilitation program following serious knee injuries can speed recovery and prevent re-injury in the future due to weakened muscles. Contact your doctor or a physical therapist for more information on knee rehabilitation.  ° °IF YOU ARE TRANSFERRED TO A SKILLED REHAB FACILITY °If the patient is transferred to a skilled rehab facility following release from the hospital, a list of the current medications will be sent to the facility for the patient to continue.  When discharged from the skilled rehab facility, please have the facility set up the patient's Home Health Physical Therapy prior to being released. Also, the skilled facility will be responsible for providing the patient with their medications at time of release from the facility to include their pain medication, the muscle relaxants, and their blood thinner medication. If the patient is still at the rehab facility at time of the two week follow up appointment, the skilled rehab facility will also need to assist the patient in arranging follow up appointment in our office and any transportation needs. ° °MAKE SURE YOU:  °Understand these instructions.  °Get help right away if you are not doing well or get worse.  ° ° °Pick up stool softner and laxative for home use following surgery while on pain medications. °Do not submerge incision under water. °Please use good hand washing techniques while changing dressing each day. °May shower starting three days after surgery. °Please use a clean towel to pat the incision dry following  showers. °Continue to use ice for pain and swelling after surgery. °Do not use any lotions or creams on the incision until instructed by your surgeon. ° °Take Eliquis twice a day for two and a half more weeks, then discontinue Eliquis. °Once the patient has completed the blood thinner regimen, then take a Baby 81 mg Aspirin daily for three more weeks. ° ° °  Information on my medicine - ELIQUIS® (apixaban) ° °This medication education was reviewed with me or my healthcare representative as part of my discharge preparation.  The pharmacist that spoke with me during my hospital stay was:  Legge, Justin Marshall, RPH ° °Why was Eliquis® prescribed for you? °Eliquis® was prescribed for you to reduce the risk of blood clots forming after orthopedic surgery.   ° °What do You need to know about Eliquis®? °Take your Eliquis® TWICE DAILY - one tablet in the morning and one tablet in the evening with or without food.  It would be best to take the dose about the same time each day. ° °If you have difficulty swallowing the tablet whole please discuss with your pharmacist how to take the medication safely. ° °Take Eliquis® exactly as prescribed by your doctor and DO NOT stop taking Eliquis® without talking to the doctor who prescribed the medication.  Stopping without other medication to take the place of Eliquis® may increase your risk of developing a clot. ° °After discharge, you should have regular check-up appointments with your healthcare provider that is prescribing your Eliquis®. ° °What do you do if you miss a dose? °If a dose of ELIQUIS® is not taken at the scheduled time, take it as soon as possible on the same day and twice-daily administration should be resumed.  The dose should not be doubled to make up for a missed dose.  Do not take more than one tablet of ELIQUIS at the same time. ° °Important Safety Information °A possible side effect of Eliquis® is bleeding. You should call your healthcare provider right away if  you experience any of the following: °? Bleeding from an injury or your nose that does not stop. °? Unusual colored urine (red or dark brown) or unusual colored stools (red or black). °? Unusual bruising for unknown reasons. °? A serious fall or if you hit your head (even if there is no bleeding). ° °Some medicines may interact with Eliquis® and might increase your risk of bleeding or clotting while on Eliquis®. To help avoid this, consult your healthcare provider or pharmacist prior to using any new prescription or non-prescription medications, including herbals, vitamins, non-steroidal anti-inflammatory drugs (NSAIDs) and supplements. ° °This website has more information on Eliquis® (apixaban): http://www.eliquis.com/eliquis/home °

## 2015-02-26 NOTE — Discharge Summary (Signed)
Physician Discharge Summary   Patient ID: Lawrence Wilson MRN: [id number] DOB/AGE: [birth year] 82 y.o.  Admit date: 02/25/2015 Discharge date: 02-27-2015  Primary Diagnosis:  Osteoarthritis Left knee(s)  Admission Diagnoses:  Past Medical History  Diagnosis Date  . Hypertension   . Hypothyroid   . Shingles   . Neuromuscular disorder ([place])     peripheral neuropathy   . Sleep apnea     cpap- 9   . Urinary frequency   . Arthritis   . History of skin cancer   . GERD (gastroesophageal reflux disease)     occasional  . CLL (chronic lymphocytic leukemia) ([place])     followed by DR. HUFF AT [facility]  . Colon cancer [facility])    Discharge Diagnoses:   Active Problems:   OA (osteoarthritis) of knee  Estimated body mass index is 29.08 kg/(m^2) as calculated from the following:   Height as of this encounter: 5' 9"  (1.753 m).   Weight as of this encounter: 89.359 kg (197 lb).  Procedure:  Procedure(s) (LRB): LEFT TOTAL KNEE ARTHROPLASTY (Left)   Consults: None  HPI: Lawrence Wilson is a 82 y.o. year old male with end stage OA of his left knee with progressively worsening pain and dysfunction. He has constant pain, with activity and at rest and significant functional deficits with difficulties even with ADLs. He has had extensive non-op management including analgesics, injections of cortisone and viscosupplements, and home exercise program, but remains in significant pain with significant dysfunction. Radiographs show bone on bone arthritis medial and patellofemoral. He presents now for left Total Knee Arthroplasty.   Laboratory Data: Admission on 02/25/2015  Component Date Value Ref Range Status  . Glucose-Capillary 02/25/2015 163* 65 - 99 mg/dL Final  . WBC 02/26/2015 15.8* 4.0 - 10.5 K/uL Final  . RBC 02/26/2015 3.50* 4.22 - 5.81 MIL/uL Final  . Hemoglobin 02/26/2015 10.8* 13.0 - 17.0 g/dL Final  . HCT 02/26/2015 33.4* 39.0 - 52.0 % Final  . MCV 02/26/2015 95.4  78.0 - 100.0 fL Final  . MCH  02/26/2015 30.9  26.0 - 34.0 pg Final  . MCHC 02/26/2015 32.3  30.0 - 36.0 g/dL Final  . RDW 02/26/2015 14.0  11.5 - 15.5 % Final  . Platelets 02/26/2015 185  150 - 400 K/uL Final  . Sodium 02/26/2015 142  135 - 145 mmol/L Final  . Potassium 02/26/2015 4.5  3.5 - 5.1 mmol/L Final  . Chloride 02/26/2015 110  101 - 111 mmol/L Final  . CO2 02/26/2015 26  22 - 32 mmol/L Final  . Glucose, Bld 02/26/2015 158* 65 - 99 mg/dL Final  . BUN 02/26/2015 24* 6 - 20 mg/dL Final  . Creatinine, Ser 02/26/2015 1.08  0.61 - 1.24 mg/dL Final  . Calcium 02/26/2015 8.1* 8.9 - 10.3 mg/dL Final  . GFR calc non Af Amer 02/26/2015 >60  >60 mL/min Final  . GFR calc Af Amer 02/26/2015 >60  >60 mL/min Final   Comment: (NOTE) The eGFR has been calculated using the CKD EPI equation. This calculation has not been validated in all clinical situations. eGFR's persistently <60 mL/min signify possible Chronic Kidney Disease.   Georgiann Hahn gap 02/26/2015 6  5 - 15 Final  Hospital Outpatient Visit on 02/19/2015  Component Date Value Ref Range Status  . aPTT 02/19/2015 26  24 - 37 seconds Final  . WBC 02/19/2015 17.4* 4.0 - 10.5 K/uL Final  . RBC 02/19/2015 4.23  4.22 - 5.81 MIL/uL Final  . Hemoglobin 02/19/2015 13.0  13.0 - 17.0 g/dL Final  . HCT 02/19/2015 40.5  39.0 - 52.0 % Final  . MCV 02/19/2015 95.7  78.0 - 100.0 fL Final  . MCH 02/19/2015 30.7  26.0 - 34.0 pg Final  . MCHC 02/19/2015 32.1  30.0 - 36.0 g/dL Final  . RDW 02/19/2015 14.2  11.5 - 15.5 % Final  . Platelets 02/19/2015 259  150 - 400 K/uL Final  . Sodium 02/19/2015 143  135 - 145 mmol/L Final  . Potassium 02/19/2015 4.7  3.5 - 5.1 mmol/L Final  . Chloride 02/19/2015 110  101 - 111 mmol/L Final  . CO2 02/19/2015 25  22 - 32 mmol/L Final  . Glucose, Bld 02/19/2015 151* 65 - 99 mg/dL Final  . BUN 02/19/2015 29* 6 - 20 mg/dL Final  . Creatinine, Ser 02/19/2015 1.23  0.61 - 1.24 mg/dL Final  . Calcium 02/19/2015 9.1  8.9 - 10.3 mg/dL Final  . Total  Protein 02/19/2015 6.4* 6.5 - 8.1 g/dL Final  . Albumin 02/19/2015 3.9  3.5 - 5.0 g/dL Final  . AST 02/19/2015 27  15 - 41 U/L Final  . ALT 02/19/2015 37  17 - 63 U/L Final  . Alkaline Phosphatase 02/19/2015 58  38 - 126 U/L Final  . Total Bilirubin 02/19/2015 0.6  0.3 - 1.2 mg/dL Final  . GFR calc non Af Amer 02/19/2015 52* >60 mL/min Final  . GFR calc Af Amer 02/19/2015 >60  >60 mL/min Final   Comment: (NOTE) The eGFR has been calculated using the CKD EPI equation. This calculation has not been validated in all clinical situations. eGFR's persistently <60 mL/min signify possible Chronic Kidney Disease.   . Anion gap 02/19/2015 8  5 - 15 Final  . Prothrombin Time 02/19/2015 14.1  11.6 - 15.2 seconds Final  . INR 02/19/2015 1.07  0.00 - 1.49 Final  . ABO/RH(D) 02/19/2015 B POS   Final  . Antibody Screen 02/19/2015 NEG   Final  . Sample Expiration 02/19/2015 03/05/2015   Final  . Extend sample reason 02/19/2015 NO TRANSFUSIONS OR PREGNANCY IN THE PAST 3 MONTHS   Final  . Color, Urine 02/19/2015 YELLOW  YELLOW Final  . APPearance 02/19/2015 CLEAR  CLEAR Final  . Specific Gravity, Urine 02/19/2015 1.029  1.005 - 1.030 Final  . pH 02/19/2015 5.5  5.0 - 8.0 Final  . Glucose, UA 02/19/2015 NEGATIVE  NEGATIVE mg/dL Final  . Hgb urine dipstick 02/19/2015 NEGATIVE  NEGATIVE Final  . Bilirubin Urine 02/19/2015 NEGATIVE  NEGATIVE Final  . Ketones, ur 02/19/2015 NEGATIVE  NEGATIVE mg/dL Final  . Protein, ur 02/19/2015 NEGATIVE  NEGATIVE mg/dL Final  . Nitrite 02/19/2015 NEGATIVE  NEGATIVE Final  . Leukocytes, UA 02/19/2015 TRACE* NEGATIVE Final  . MRSA, PCR 02/19/2015 NEGATIVE  NEGATIVE Final  . Staphylococcus aureus 02/19/2015 NEGATIVE  NEGATIVE Final   Comment:        The Xpert SA Assay (FDA approved for NASAL specimens in patients over 29 years of age), is one component of a comprehensive surveillance program.  Test performance has been validated by [facility] for patients  greater than or equal to 63 year old. It is not intended to diagnose infection nor to guide or monitor treatment.   . Squamous Epithelial / LPF 02/19/2015 0-5* NONE SEEN Final  . WBC, UA 02/19/2015 0-5  0 - 5 WBC/hpf Final  . RBC / HPF 02/19/2015 0-5  0 - 5 RBC/hpf Final  . Bacteria, UA 02/19/2015 RARE* NONE SEEN Final  .  Casts 02/19/2015 HYALINE CASTS* NEGATIVE Final  . Urine-Other 02/19/2015 MUCOUS PRESENT   Final     X-Rays:No results found.  EKG: Orders placed or performed during the hospital encounter of 03/27/12  . ED EKG  . ED EKG  . EKG 12-Lead  . EKG 12-Lead  . EKG     Hospital Course: Lawrence Wilson is a 82 y.o. who was admitted to [facility]. They were brought to the operating room on 02/25/2015 and underwent Procedure(s): LEFT TOTAL KNEE ARTHROPLASTY.  Patient tolerated the procedure well and was later transferred to the recovery room and then to the orthopaedic floor for postoperative care.  They were given PO and IV analgesics for pain control following their surgery.  They were given 24 hours of postoperative antibiotics of  Anti-infectives    Start     Dose/Rate Route Frequency Ordered Stop   02/25/15 1600  ceFAZolin (ANCEF) IVPB 2 g/50 mL premix     2 g 100 mL/hr over 30 Minutes Intravenous Every 6 hours 02/25/15 1317 02/25/15 2326   02/25/15 0738  ceFAZolin (ANCEF) IVPB 2 g/50 mL premix     2 g 100 mL/hr over 30 Minutes Intravenous On call to O.R. 02/25/15 0739 02/25/15 1009     and started on DVT prophylaxis in the form of Eliquis.   PT and OT were ordered for total joint protocol.  Discharge planning consulted to help with postop disposition and equipment needs.  Patient had a decemt night on the evening of surgery.  They started to get up OOB with therapy on day one. Hemovac drain was pulled without difficulty.  Continued to work with therapy into day two.  Dressing was changed on day two and the incision was healing well. Patient was seen in rounds and  was ready to go home.  Discharge home with home health Diet - Cardiac diet and Renal diet Follow up - in 2 weeks Activity - WBAT Disposition - Home Condition Upon Discharge - Good D/C Meds - See DC Summary DVT Prophylaxis - Eliquis   Discharge Instructions    Call MD / Call 911    Complete by:  As directed   If you experience chest pain or shortness of breath, CALL 911 and be transported to the hospital emergency room.  If you develope a fever above 101 F, pus (white drainage) or increased drainage or redness at the wound, or calf pain, call your surgeon's office.     Change dressing    Complete by:  As directed   Change dressing daily with sterile 4 x 4 inch gauze dressing and apply TED hose. Do not submerge the incision under water.     Constipation Prevention    Complete by:  As directed   Drink plenty of fluids.  Prune juice may be helpful.  You may use a stool softener, such as Colace (over the counter) 100 mg twice a day.  Use MiraLax (over the counter) for constipation as needed.     Diet - low sodium heart healthy    Complete by:  As directed      Discharge instructions    Complete by:  As directed   Pick up stool softner and laxative for home use following surgery while on pain medications. Do not submerge incision under water. Please use good hand washing techniques while changing dressing each day. May shower starting three days after surgery. Please use a clean towel to pat the incision dry following showers. Continue  to use ice for pain and swelling after surgery. Do not use any lotions or creams on the incision until instructed by your surgeon.  Take Eliquis twice a day for two and a half more weeks, then discontinue Eliquis. Once the patient has completed the blood thinner regimen, then take a Baby 81 mg Aspirin daily for three more weeks.  Postoperative Constipation Protocol  Constipation - defined medically as fewer than three stools per week and severe  constipation as less than one stool per week.  One of the most common issues patients have following surgery is constipation.  Even if you have a regular bowel pattern at home, your normal regimen is likely to be disrupted due to multiple reasons following surgery.  Combination of anesthesia, postoperative narcotics, change in appetite and fluid intake all can affect your bowels.  In order to avoid complications following surgery, here are some recommendations in order to help you during your recovery period.  Colace (docusate) - Pick up an over-the-counter form of Colace or another stool softener and take twice a day as long as you are requiring postoperative pain medications.  Take with a full glass of water daily.  If you experience loose stools or diarrhea, hold the colace until you stool forms back up.  If your symptoms do not get better within 1 week or if they get worse, check with your doctor.  Dulcolax (bisacodyl) - Pick up over-the-counter and take as directed by the product packaging as needed to assist with the movement of your bowels.  Take with a full glass of water.  Use this product as needed if not relieved by Colace only.   MiraLax (polyethylene glycol) - Pick up over-the-counter to have on hand.  MiraLax is a solution that will increase the amount of water in your bowels to assist with bowel movements.  Take as directed and can mix with a glass of water, juice, soda, coffee, or tea.  Take if you go more than two days without a movement. Do not use MiraLax more than once per day. Call your doctor if you are still constipated or irregular after using this medication for 7 days in a row.  If you continue to have problems with postoperative constipation, please contact the office for further assistance and recommendations.  If you experience "the worst abdominal pain ever" or develop nausea or vomiting, please contact the office immediatly for further recommendations for treatment.     Do  not put a pillow under the knee. Place it under the heel.    Complete by:  As directed      Do not sit on low chairs, stoools or toilet seats, as it may be difficult to get up from low surfaces    Complete by:  As directed      Driving restrictions    Complete by:  As directed   No driving until released by the physician.     Increase activity slowly as tolerated    Complete by:  As directed      Lifting restrictions    Complete by:  As directed   No lifting until released by the physician.     Patient may shower    Complete by:  As directed   You may shower without a dressing once there is no drainage.  Do not wash over the wound.  If drainage remains, do not shower until drainage stops.     TED hose    Complete by:  As directed   Use stockings (TED hose) for 3 weeks on both leg(s).  You may remove them at night for sleeping.     Weight bearing as tolerated    Complete by:  As directed   Laterality:  left  Extremity:  Lower            Medication List    STOP taking these medications        naproxen sodium 220 MG tablet  Commonly known as:  ANAPROX      TAKE these medications        apixaban 2.5 MG Tabs tablet  Commonly known as:  ELIQUIS  Take 1 tablet (2.5 mg total) by mouth every 12 (twelve) hours. Take Eliquis twice a day for two and a half more weeks, then discontinue Eliquis. Once the patient has completed the blood thinner regimen, then take a Baby 81 mg Aspirin daily for three more weeks.     hydroxypropyl methylcellulose / hypromellose 2.5 % ophthalmic solution  Commonly known as:  ISOPTO TEARS / GONIOVISC  Place 2 drops into both eyes 2 (two) times daily as needed for dry eyes.     levothyroxine 25 MCG tablet  Commonly known as:  SYNTHROID, LEVOTHROID  Take 25 mcg by mouth daily.     lisinopril-hydrochlorothiazide 20-12.5 MG tablet  Commonly known as:  PRINZIDE,ZESTORETIC  Take 1 tablet by mouth daily.     methocarbamol 500 MG tablet  Commonly known as:   ROBAXIN  Take 1 tablet (500 mg total) by mouth every 6 (six) hours as needed for muscle spasms.     oxyCODONE 5 MG immediate release tablet  Commonly known as:  Oxy IR/ROXICODONE  Take 1-2 tablets (5-10 mg total) by mouth every 3 (three) hours as needed for moderate pain or severe pain.     tamsulosin 0.4 MG Caps capsule  Commonly known as:  FLOMAX  Take 0.4 mg by mouth at bedtime.     traMADol 50 MG tablet  Commonly known as:  ULTRAM  Take 1-2 tablets (50-100 mg total) by mouth every 6 (six) hours as needed (mild pain).           Follow-up Information    Follow up with [facility].   Why:  home health physical therapy   Contact information:   [place] [place] [address] [phone]       Follow up with Gearlean Alf, MD On 03/12/2015.   Specialty:  Orthopedic Surgery   Why:  Call office at [phone] to setup appointment on Tuesday 03/12/2015.   Contact information:   [address] [place] [address] [phone]       Signed: Arlee Muslim, PA-C Orthopaedic Surgery 02/26/2015, 11:11 PM

## 2015-02-26 NOTE — Progress Notes (Signed)
   Subjective: 1 Day Post-Op Procedure(s) (LRB): LEFT TOTAL KNEE ARTHROPLASTY (Left) Patient reports pain as mild.   Patient seen in rounds with Dr. Wynelle Link. Patient is well, and has had no acute complaints or problems. NO CP/SOB/N/V We will start therapy today.  Plan is to go Home after hospital stay.  Objective: Vital signs in last 24 hours: Temp:  [97.5 F (36.4 C)-98.7 F (37.1 C)] 98.5 F (36.9 C) (01/17 0658) Pulse Rate:  [60-79] 60 (01/17 0658) Resp:  [16-20] 18 (01/17 0658) BP: (131-168)/(53-78) 136/57 mmHg (01/17 0658) SpO2:  [94 %-100 %] 96 % (01/17 0658)  Intake/Output from previous day:  Intake/Output Summary (Last 24 hours) at 02/26/15 0803 Last data filed at 02/26/15 0658  Gross per 24 hour  Intake 3946.25 ml  Output   2965 ml  Net 981.25 ml    Intake/Output this shift: UOP 950  Labs:  Recent Labs  02/26/15 0419  HGB 10.8*    Recent Labs  02/26/15 0419  WBC 15.8*  RBC 3.50*  HCT 33.4*  PLT 185    Recent Labs  02/26/15 0419  NA 142  K 4.5  CL 110  CO2 26  BUN 24*  CREATININE 1.08  GLUCOSE 158*  CALCIUM 8.1*   No results for input(s): LABPT, INR in the last 72 hours.  EXAM General - Patient is Alert, Appropriate and Oriented Extremity - Neurovascular intact Sensation intact distally Dorsiflexion/Plantar flexion intact Dressing - dressing C/D/I Motor Function - intact, moving foot and toes well on exam.  Hemovac pulled without difficulty.  Past Medical History  Diagnosis Date  . Hypertension   . Hypothyroid   . Shingles   . Neuromuscular disorder ([place])     peripheral neuropathy   . Sleep apnea     cpap- 9   . Urinary frequency   . Arthritis   . History of skin cancer   . GERD (gastroesophageal reflux disease)     occasional  . CLL (chronic lymphocytic leukemia) ([place])     follow[facility] DR. HUFF AT [facility]  . Colon cancer ([facility])     Assessment/Plan: 1 Day Post-Op Procedure(s) (LRB): LEFT TOTAL KNEE ARTHROPLASTY  (Left) Active Problems:   OA (osteoarthritis) of knee  Estimated body mass index is 29.08 kg/(m^2) as calculated from the following:   Height as of this encounter: 5\' 9"  (1.753 m).   Weight as of this encounter: 89.359 kg (197 lb). Advance diet Up with therapy Plan for discharge tomorrow Discharge home with home health  DVT Prophylaxis - Eliquis Weight-Bearing as tolerated to left leg D/C O2 and Pulse OX and try on Room Air  Arlee Muslim, PA-C Orthopaedic Surgery 02/26/2015, 8:03 AM

## 2015-02-26 NOTE — Progress Notes (Signed)
Physical Therapy Treatment Patient Details Name: Lawrence Wilson MRN: [id number] DOB: [birth year] Today's Date: 02/26/2015    History of Present Illness pt s/p LTKA with hisorty of RTKA last year in January.     PT Comments    POD # 1 pm session. Assisted out of recliner to amb a greater distance in hallway.  Assisted to BR then back to bed and applied ICE.  Pt progressing well and plans to D/C to home with spouse tomorrow.   Follow Up Recommendations  Home health PT     Equipment Recommendations  None recommended by PT    Recommendations for Other Services       Precautions / Restrictions Precautions Precautions: Knee Precaution Comments: Pt able to perform active SLR so D/C KI Restrictions Weight Bearing Restrictions: No Other Position/Activity Restrictions: WBAT    Mobility  Bed Mobility               General bed mobility comments: OOB in recliner  Transfers Overall transfer level: Needs assistance Equipment used: Rolling walker (2 wheeled) Transfers: Sit to/from Stand Sit to Stand: Min guard         General transfer comment: one VC on safety with turns  Ambulation/Gait Ambulation/Gait assistance: Supervision;Min guard Ambulation Distance (Feet): 110 Feet Assistive device: Rolling walker (2 wheeled) Gait Pattern/deviations: Step-to pattern;Trunk flexed;Decreased stance time - left Gait velocity: decreased   General Gait Details: one VC on proper walker to self distance and upright posture   Stairs            Wheelchair Mobility    Modified Rankin (Stroke Patients Only)       Balance                                    Cognition Arousal/Alertness: Awake/alert Behavior During Therapy: WFL for tasks assessed/performed Overall Cognitive Status: Within Functional Limits for tasks assessed                      Exercises      General Comments        Pertinent Vitals/Pain Pain Assessment: 0-10 Pain Score: 3   Pain Location: L knee Pain Descriptors / Indicators: Aching;Sore Pain Intervention(s): Monitored during session;Premedicated before session;Repositioned;Ice applied    Home Living                      Prior Function            PT Goals (current goals can now be found in the care plan section) Progress towards PT goals: Progressing toward goals    Frequency  7X/week    PT Plan Current plan remains appropriate    Co-evaluation             End of Session Equipment Utilized During Treatment: Gait belt Activity Tolerance: Patient tolerated treatment well Patient left: in chair;with call bell/phone within reach;with chair alarm set     Time: 1435-1500 PT Time Calculation (min) (ACUTE ONLY): 25 min  Charges:  $Gait Training: 8-22 mins $Therapeutic Activity: 8-22 mins                    G Codes:      Rica Koyanagi  PTA [facility]  Acute  Rehab Pager      [phone]

## 2015-02-26 NOTE — Progress Notes (Signed)
OT Cancellation Note  Patient Details Name: Adryan Rewerts MRN: [id number] DOB: 13-Jan-1930   Cancelled Treatment:    Reason Eval/Treat Not Completed: OT screened, no needs identified, will sign off. Per chart review and discussion with pt, pt with recent TKA to opposite knee and good recall of ADL education. Pt stating no need for OT. OT signing off.   Hortencia Pilar 02/26/2015, 10:21 AM

## 2015-02-26 NOTE — Care Management Note (Signed)
Case Management Note  Patient Details  Name: Lawrence Wilson MRN: [id number] Date of Birth: 10/11/1929  Subjective/Objective:                   LEFT TOTAL KNEE ARTHROPLASTY (Left) Action/Plan: Discharge planning  Expected Discharge Date:  02/27/15               Expected Discharge Plan:  [place]  In-House Referral:     Discharge planning Services  CM Consult  Post Acute Care Choice:  Home Health Choice offered to:  Patient  DME Arranged:  N/A DME Agency:  NA  HH Arranged:  PT HH Agency:  [place]  Status of Service:  Completed, signed off  Medicare Important Message Given:    Date Medicare IM Given:    Medicare IM give by:    Date Additional Medicare IM Given:    Additional Medicare Important Message give by:     If discussed at [place] of Stay Meetings, dates discussed:    Additional Comments: CM met with pt in room to offer choice of home health agency.  Pt chooses [facility] to render HHPT.  Referral given to [facility] rep, Tim (on unit).  Pt has both a rolling walker and 3n1 from previous surgery.  No other CM needs were communicated. Dellie Catholic, RN 02/26/2015, 1:32 PM

## 2015-02-27 LAB — BASIC METABOLIC PANEL
ANION GAP: 8 (ref 5–15)
BUN: 23 mg/dL — ABNORMAL HIGH (ref 6–20)
CALCIUM: 8.5 mg/dL — AB (ref 8.9–10.3)
CO2: 24 mmol/L (ref 22–32)
Chloride: 109 mmol/L (ref 101–111)
Creatinine, Ser: 1.23 mg/dL (ref 0.61–1.24)
GFR calc Af Amer: >60 mL/min (ref >60)
GFR, EST NON AFRICAN AMERICAN: 52 mL/min — AB (ref >60)
GLUCOSE: 154 mg/dL — AB (ref 65–99)
POTASSIUM: 4 mmol/L (ref 3.5–5.1)
SODIUM: 141 mmol/L (ref 135–145)

## 2015-02-27 LAB — CBC
HCT: 35.1 % — ABNORMAL LOW (ref 39.0–52.0)
Hemoglobin: 11.3 g/dL — ABNORMAL LOW (ref 13.0–17.0)
MCH: 30.5 pg (ref 26.0–34.0)
MCHC: 32.2 g/dL (ref 30.0–36.0)
MCV: 94.6 fL (ref 78.0–100.0)
PLATELETS: 190 10*3/uL (ref 150–400)
RBC: 3.71 MIL/uL — AB (ref 4.22–5.81)
RDW: 13.9 % (ref 11.5–15.5)
WBC: 20.3 10*3/uL — AB (ref 4.0–10.5)

## 2015-02-27 NOTE — Progress Notes (Signed)
Patient being discharged to home with [facility]. [facility] set up. Patient has had equipment sent to home. States he has all the equipment needed. Reviewed discharge education, instructions and medications with patient and his wife. They both state they have no questions.

## 2015-02-27 NOTE — Progress Notes (Signed)
   Subjective: 2 Days Post-Op Procedure(s) (LRB): LEFT TOTAL KNEE ARTHROPLASTY (Left) Patient reports pain as mild.   Patient seen in rounds for Dr. Wynelle Link. Patient is well, and has had no acute complaints or problems Patient is ready to go home  Objective: Vital signs in last 24 hours: Temp:  [98.5 F (36.9 C)-101.9 F (38.8 C)] 98.5 F (36.9 C) (01/18 0500) Pulse Rate:  [76-96] 83 (01/18 0500) Resp:  [16-18] 16 (01/18 0500) BP: (146-194)/(54-78) 169/78 mmHg (01/18 0500) SpO2:  [95 %-98 %] 98 % (01/18 0500)  Intake/Output from previous day:  Intake/Output Summary (Last 24 hours) at 02/27/15 1034 Last data filed at 02/27/15 0700  Gross per 24 hour  Intake 1149.17 ml  Output   1710 ml  Net -560.83 ml    Intake/Output this shift:    Labs:  Recent Labs  02/26/15 0419 02/27/15 0430  HGB 10.8* 11.3*    Recent Labs  02/26/15 0419 02/27/15 0430  WBC 15.8* 20.3*  RBC 3.50* 3.71*  HCT 33.4* 35.1*  PLT 185 190    Recent Labs  02/26/15 0419 02/27/15 0430  NA 142 141  K 4.5 4.0  CL 110 109  CO2 26 24  BUN 24* 23*  CREATININE 1.08 1.23  GLUCOSE 158* 154*  CALCIUM 8.1* 8.5*   No results for input(s): LABPT, INR in the last 72 hours.  EXAM: General - Patient is Alert Extremity - Neurovascular intact Sensation intact distally Incision - clean, dry Motor Function - intact, moving foot and toes well on exam.   Assessment/Plan: 2 Days Post-Op Procedure(s) (LRB): LEFT TOTAL KNEE ARTHROPLASTY (Left) Procedure(s) (LRB): LEFT TOTAL KNEE ARTHROPLASTY (Left) Past Medical History  Diagnosis Date  . Hypertension   . Hypothyroid   . Shingles   . Neuromuscular disorder ([place])     peripheral neuropathy   . Sleep apnea     cpap- 9   . Urinary frequency   . Arthritis   . History of skin cancer   . GERD (gastroesophageal reflux disease)     occasional  . CLL (chronic lymphocytic leukemia) ([place])     followed by DR. HUFF AT [facility]  . Colon cancer ([place])     Active Problems:   OA (osteoarthritis) of knee  Estimated body mass index is 29.08 kg/(m^2) as calculated from the following:   Height as of this encounter: 5\' 9"  (1.753 m).   Weight as of this encounter: 89.359 kg (197 lb). Up with therapy Discharge home with home health Diet - Cardiac diet and Renal diet Follow up - in 2 weeks Activity - WBAT Disposition - Home Condition Upon Discharge - Good D/C Meds - See DC Summary DVT Prophylaxis - Eliquis  Arlee Muslim, PA-C Orthopaedic Surgery 02/27/2015, 10:34 AM

## 2015-02-27 NOTE — Progress Notes (Signed)
Physical Therapy Treatment Patient Details Name: Lawrence Wilson MRN: [id number] DOB: May 16, 1929 Today's Date: 02/27/2015    History of Present Illness pt s/p LTKA with hisorty of RTKA last year in January.     PT Comments    POD #2  Assisted with amb in hallway then performed all supine TKR TE's followed by ICE.  Pt ready for D/C to home.   Follow Up Recommendations  Home health PT     Equipment Recommendations  None recommended by PT    Recommendations for Other Services       Precautions / Restrictions Precautions Precautions: Knee Precaution Comments: Pt able to perform active SLR so D/C KI Required Braces or Orthoses: Knee Immobilizer - Left Restrictions Weight Bearing Restrictions: No Other Position/Activity Restrictions: WBAT    Mobility  Bed Mobility               General bed mobility comments: OOB in recliner  Transfers Overall transfer level: Needs assistance Equipment used: Rolling walker (2 wheeled) Transfers: Sit to/from Stand Sit to Stand: Supervision         General transfer comment: one VC on safety with turns  Ambulation/Gait Ambulation/Gait assistance: Supervision Ambulation Distance (Feet): 85 Feet Assistive device: Rolling walker (2 wheeled) Gait Pattern/deviations: Step-through pattern     General Gait Details: one VC on proper walker to self distance and upright posture   Stairs            Wheelchair Mobility    Modified Rankin (Stroke Patients Only)       Balance                                    Cognition Arousal/Alertness: Awake/alert Behavior During Therapy: WFL for tasks assessed/performed Overall Cognitive Status: Within Functional Limits for tasks assessed                      Exercises   Total Knee Replacement TE's 10 reps B LE ankle pumps 10 reps towel squeezes 10 reps knee presses 10 reps heel slides  10 reps SAQ's 10 reps SLR's 10 reps ABD Followed by ICE     General  Comments        Pertinent Vitals/Pain Pain Assessment: 0-10 Pain Score: 5  Pain Descriptors / Indicators: Aching;Sore Pain Intervention(s): Monitored during session;Premedicated before session;Repositioned;Ice applied    Home Living                      Prior Function            PT Goals (current goals can now be found in the care plan section) Progress towards PT goals: Progressing toward goals    Frequency  7X/week    PT Plan Current plan remains appropriate    Co-evaluation             End of Session Equipment Utilized During Treatment: Gait belt Activity Tolerance: Patient tolerated treatment well Patient left: in chair;with call bell/phone within reach;with chair alarm set     Time: [phone] PT Time Calculation (min) (ACUTE ONLY): 25 min  Charges:  $Gait Training: 8-22 mins $Therapeutic Exercise: 8-22 mins                    G Codes:      Rica Koyanagi  PTA [facility]  Acute  Rehab Pager      [phone]

## 2016-12-09 IMAGING — CR DG CHEST 2V
2 series · 2 of 2 positions shown · non-contrast
Comparison: None.

CLINICAL DATA: Preoperative evaluation for knee surgery

EXAM:
CHEST  2 VIEW

[w chest pa]
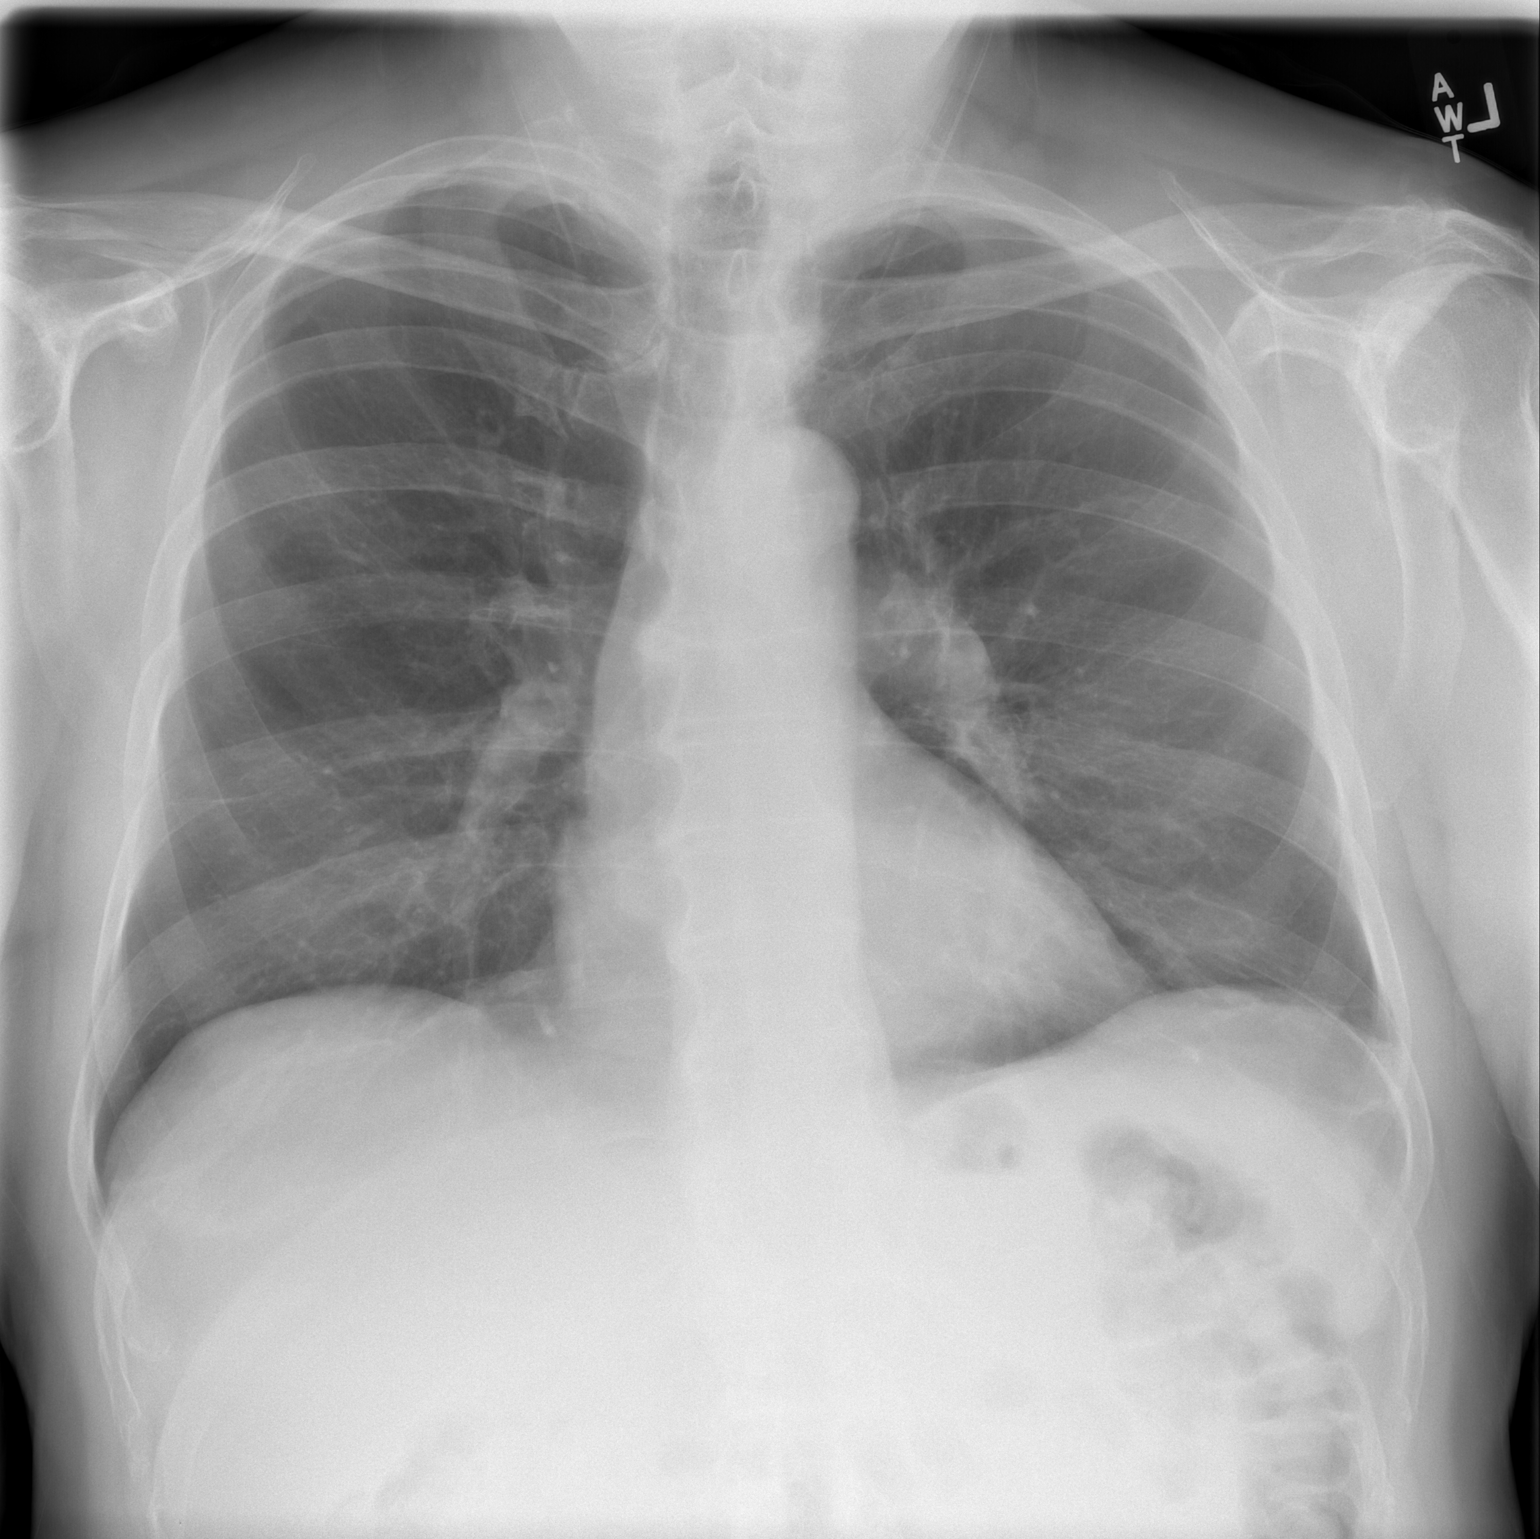

[w chest lat]
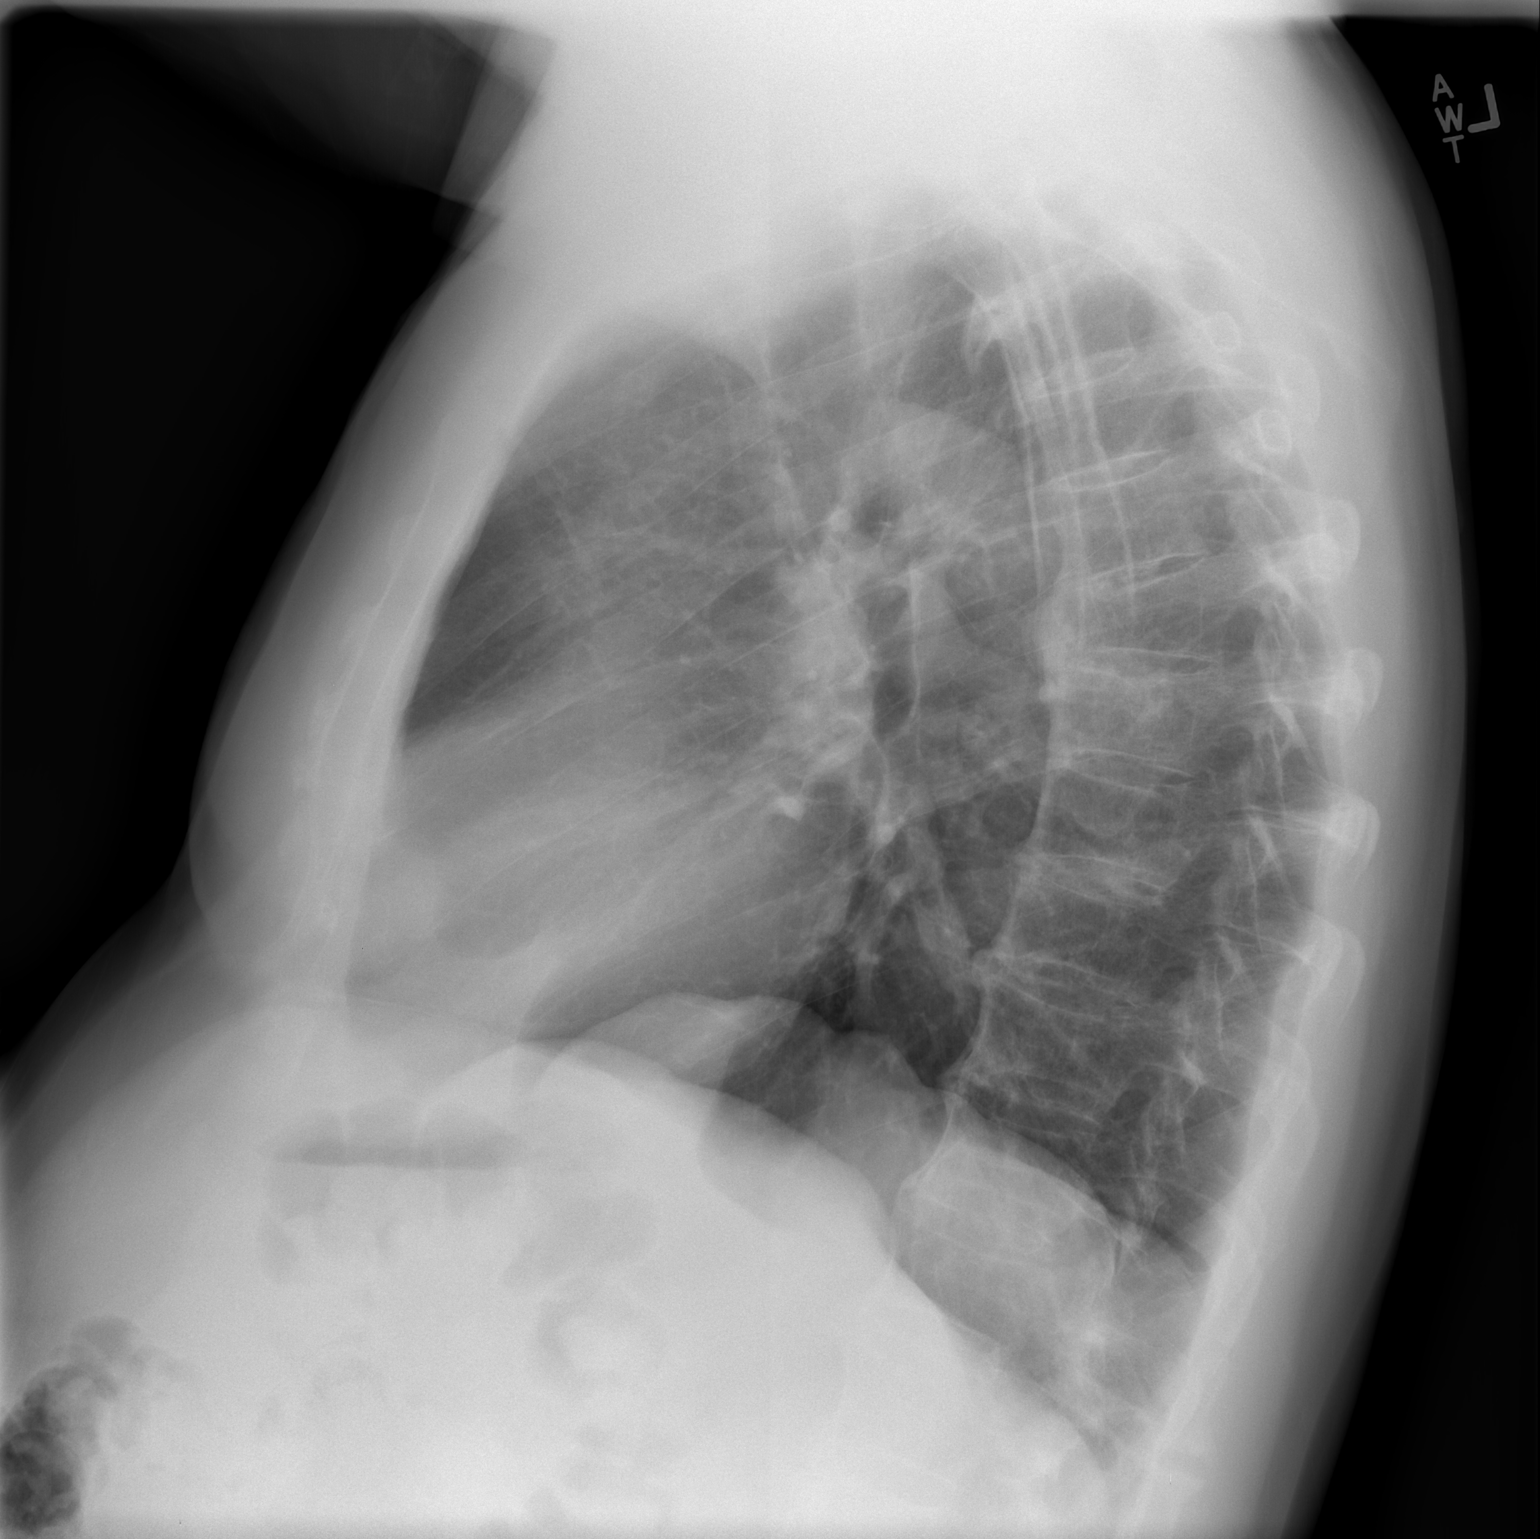

[2 of 2 positions shown; findings below may reference images not displayed]

FINDINGS: Cardiac shadow is within normal limits. The lungs are clear
bilaterally. A compression deformity is noted in the mid upper
thoracic spine likely of a chronic nature.
IMPRESSION: No active cardiopulmonary disease.

## 2018-08-02 ENCOUNTER — Other Ambulatory Visit: Payer: Self-pay

## 2018-08-02 ENCOUNTER — Other Ambulatory Visit: Payer: Self-pay | Admitting: Orthopedic Surgery

## 2018-08-02 ENCOUNTER — Encounter (HOSPITAL_BASED_OUTPATIENT_CLINIC_OR_DEPARTMENT_OTHER): Payer: Self-pay | Admitting: *Deleted

## 2018-08-08 ENCOUNTER — Ambulatory Visit (HOSPITAL_BASED_OUTPATIENT_CLINIC_OR_DEPARTMENT_OTHER): Admission: RE | Admit: 2018-08-08 | Payer: Medicare Other | Source: Home / Self Care | Admitting: Orthopedic Surgery

## 2018-08-08 HISTORY — DX: Type 2 diabetes mellitus without complications: E11.9

## 2018-08-08 SURGERY — RELEASE, CARPAL TUNNEL, ENDOSCOPIC
Anesthesia: Monitor Anesthesia Care | Laterality: Left

## 2021-12-13 ENCOUNTER — Emergency Department (HOSPITAL_BASED_OUTPATIENT_CLINIC_OR_DEPARTMENT_OTHER): Payer: Medicare (Managed Care)

## 2021-12-13 ENCOUNTER — Encounter (HOSPITAL_BASED_OUTPATIENT_CLINIC_OR_DEPARTMENT_OTHER): Payer: Self-pay | Admitting: Emergency Medicine

## 2021-12-13 ENCOUNTER — Emergency Department (HOSPITAL_BASED_OUTPATIENT_CLINIC_OR_DEPARTMENT_OTHER)
Admission: EM | Admit: 2021-12-13 | Discharge: 2021-12-13 | Disposition: A | Discharge status: Home / Self Care | Payer: Medicare (Managed Care) | Attending: Emergency Medicine | Admitting: Emergency Medicine

## 2021-12-13 DIAGNOSIS — R5383 Other fatigue: Secondary | ICD-10-CM | POA: Diagnosis not present

## 2021-12-13 DIAGNOSIS — R739 Hyperglycemia, unspecified: Secondary | ICD-10-CM

## 2021-12-13 DIAGNOSIS — Z7984 Long term (current) use of oral hypoglycemic drugs: Secondary | ICD-10-CM | POA: Diagnosis not present

## 2021-12-13 DIAGNOSIS — E119 Type 2 diabetes mellitus without complications: Secondary | ICD-10-CM | POA: Insufficient documentation

## 2021-12-13 DIAGNOSIS — Z85038 Personal history of other malignant neoplasm of large intestine: Secondary | ICD-10-CM | POA: Diagnosis not present

## 2021-12-13 DIAGNOSIS — E039 Hypothyroidism, unspecified: Secondary | ICD-10-CM | POA: Diagnosis not present

## 2021-12-13 DIAGNOSIS — Z85828 Personal history of other malignant neoplasm of skin: Secondary | ICD-10-CM | POA: Insufficient documentation

## 2021-12-13 DIAGNOSIS — Z79899 Other long term (current) drug therapy: Secondary | ICD-10-CM | POA: Insufficient documentation

## 2021-12-13 DIAGNOSIS — E860 Dehydration: Secondary | ICD-10-CM

## 2021-12-13 DIAGNOSIS — R531 Weakness: Secondary | ICD-10-CM | POA: Insufficient documentation

## 2021-12-13 DIAGNOSIS — Z794 Long term (current) use of insulin: Secondary | ICD-10-CM | POA: Insufficient documentation

## 2021-12-13 DIAGNOSIS — I10 Essential (primary) hypertension: Secondary | ICD-10-CM | POA: Diagnosis not present

## 2021-12-13 LAB — URINALYSIS, MICROSCOPIC (REFLEX): RBC / HPF: NONE SEEN RBC/hpf (ref 0–5)

## 2021-12-13 LAB — CBC WITH DIFFERENTIAL/PLATELET
Abs Immature Granulocytes: 0.06 10*3/uL (ref 0.00–0.07)
Basophils Absolute: 0 10*3/uL (ref 0.0–0.1)
Basophils Relative: 0 %
Eosinophils Absolute: 0.1 10*3/uL (ref 0.0–0.5)
Eosinophils Relative: 0 %
HCT: 40.8 % (ref 39.0–52.0)
Hemoglobin: 13.4 g/dL (ref 13.0–17.0)
Immature Granulocytes: 0 %
Lymphocytes Relative: 59 %
Lymphs Abs: 9.5 10*3/uL — ABNORMAL HIGH (ref 0.7–4.0)
MCH: 30.6 pg (ref 26.0–34.0)
MCHC: 32.8 g/dL (ref 30.0–36.0)
MCV: 93.2 fL (ref 80.0–100.0)
Monocytes Absolute: 0.5 10*3/uL (ref 0.1–1.0)
Monocytes Relative: 3 %
Neutro Abs: 6.1 10*3/uL (ref 1.7–7.7)
Neutrophils Relative %: 38 %
Platelets: 190 10*3/uL (ref 150–400)
RBC: 4.38 MIL/uL (ref 4.22–5.81)
RDW: 13.7 % (ref 11.5–15.5)
Smear Review: NORMAL
WBC: 16.2 10*3/uL — ABNORMAL HIGH (ref 4.0–10.5)
nRBC: 0 % (ref 0.0–0.2)

## 2021-12-13 LAB — COMPREHENSIVE METABOLIC PANEL
ALT: 32 U/L (ref 0–44)
AST: 27 U/L (ref 15–41)
Albumin: 3.5 g/dL (ref 3.5–5.0)
Alkaline Phosphatase: 121 U/L (ref 38–126)
Anion gap: 10 (ref 5–15)
BUN: 39 mg/dL — ABNORMAL HIGH (ref 8–23)
CO2: 25 mmol/L (ref 22–32)
Calcium: 8.5 mg/dL — ABNORMAL LOW (ref 8.9–10.3)
Chloride: 101 mmol/L (ref 98–111)
Creatinine, Ser: 1.7 mg/dL — ABNORMAL HIGH (ref 0.61–1.24)
GFR, Estimated: 38 mL/min — ABNORMAL LOW (ref >60)
Glucose, Bld: 577 mg/dL (ref 70–99)
Potassium: 5 mmol/L (ref 3.5–5.1)
Sodium: 136 mmol/L (ref 135–145)
Total Bilirubin: 1.1 mg/dL (ref 0.3–1.2)
Total Protein: 6.7 g/dL (ref 6.5–8.1)

## 2021-12-13 LAB — URINALYSIS, ROUTINE W REFLEX MICROSCOPIC
Bilirubin Urine: NEGATIVE
Glucose, UA: >=500 mg/dL — AB
Hgb urine dipstick: NEGATIVE
Ketones, ur: NEGATIVE mg/dL
Leukocytes,Ua: NEGATIVE
Nitrite: NEGATIVE
Protein, ur: NEGATIVE mg/dL
Specific Gravity, Urine: 1.01 (ref 1.005–1.030)
pH: 5 (ref 5.0–8.0)

## 2021-12-13 LAB — CBG MONITORING, ED
Glucose-Capillary: 431 mg/dL — ABNORMAL HIGH (ref 70–99)
Glucose-Capillary: 344 mg/dL — ABNORMAL HIGH (ref 70–99)

## 2021-12-13 LAB — TSH: TSH: 1.021 u[IU]/mL (ref 0.350–4.500)

## 2021-12-13 MED ORDER — SODIUM CHLORIDE 0.9 % IV BOLUS
1000.0000 mL | Freq: Once | INTRAVENOUS | Status: AC
  Administered 2021-12-13: 1000 mL via INTRAVENOUS

## 2021-12-13 MED ORDER — BLOOD GLUCOSE MONITOR KIT: PACK | 0 refills | Status: AC

## 2021-12-13 MED ORDER — INSULIN ASPART 100 UNIT/ML IJ SOLN
10.0000 [IU] | Freq: Once | INTRAMUSCULAR | Status: AC
  Administered 2021-12-13: 10 [IU] via SUBCUTANEOUS

## 2021-12-13 MED ORDER — INSULIN REGULAR HUMAN 100 UNIT/ML IJ SOLN: 8.0000 [IU] | Freq: Once | INTRAMUSCULAR | Status: DC

## 2021-12-13 MED ORDER — LACTATED RINGERS IV BOLUS
1000.0000 mL | Freq: Once | INTRAVENOUS | Status: AC
  Administered 2021-12-13: 1000 mL via INTRAVENOUS

## 2021-12-13 MED ORDER — INSULIN ASPART 100 UNIT/ML IJ SOLN
8.0000 [IU] | Freq: Once | INTRAMUSCULAR | Status: AC
  Administered 2021-12-13: 8 [IU] via INTRAVENOUS

## 2021-12-13 NOTE — ED Notes (Signed)
Patient family states they will report to [facility] [place]  about patients visit

## 2021-12-13 NOTE — ED Provider Notes (Signed)
[place] EMERGENCY DEPARTMENT Provider Note   CSN: [id number] Arrival date & time: 12/13/21  1350     History  Chief Complaint  Patient presents with   Extremity Weakness    Lawrence Wilson is a 88 y.o. male.   Extremity Weakness  Patient presents with generalized weakness.  And fatigue.  Is had going for last couple weeks.  Reported has not been taking his medicine.  Patient is the father of local Dr. Ashok Cordia.  No fevers or chills.  States he has been eating well.  Presents also with his other son.  Reportedly has not been taking either his thyroid medicines or his diabetes medicine.    Past Medical History:  Diagnosis Date   Arthritis    CLL (chronic lymphocytic leukemia) ([place])    followed by DR. HUFF AT [facility]   Colon cancer [facility])    Diabetes mellitus without complication ([facility])    GERD (gastroesophageal reflux disease)    occasional   History of skin cancer    Hypertension    Hypothyroid    Neuromuscular disorder ([place])    peripheral neuropathy    Shingles    Sleep apnea    cpap- 9    Urinary frequency     Home Medications Prior to Admission medications   Medication Sig Start Date End Date Taking? Authorizing Provider  glipiZIDE (GLUCOTROL) 10 MG tablet Take 10 mg by mouth 2 (two) times daily before a meal.    [provider]  hydroxypropyl methylcellulose / hypromellose (ISOPTO TEARS / GONIOVISC) 2.5 % ophthalmic solution Place 2 drops into both eyes 2 (two) times daily as needed for dry eyes.    [provider]  levothyroxine (SYNTHROID, LEVOTHROID) 25 MCG tablet Take 25 mcg by mouth daily.    [provider]  lisinopril-hydrochlorothiazide (PRINZIDE,ZESTORETIC) 20-12.5 MG per tablet Take 1 tablet by mouth daily.    [provider]  Semaglutide, 1 MG/DOSE, (OZEMPIC, 1 MG/DOSE,) 2 MG/1.5ML SOPN Inject into the skin once a week.    [provider]  Tamsulosin HCl (FLOMAX) 0.4 MG CAPS Take 0.4 mg by mouth at  bedtime.     [provider]      Allergies    Xarelto [rivaroxaban]    Review of Systems   Review of Systems  Musculoskeletal:  Positive for extremity weakness.    Physical Exam Updated Vital Signs BP 132/68   Pulse 76   Temp 97.9 F (36.6 C) (Oral)   Resp 16   SpO2 96%  Physical Exam Vitals and nursing note reviewed.  Cardiovascular:     Rate and Rhythm: Regular rhythm.  Abdominal:     Tenderness: There is no abdominal tenderness.  Musculoskeletal:        General: No tenderness.     Cervical back: Neck supple.  Skin:    General: Skin is warm.     Capillary Refill: Capillary refill takes less than 2 seconds.  Neurological:     Mental Status: He is alert and oriented to person, place, and time.     ED Results / Procedures / Treatments   Labs (all labs ordered are listed, but only abnormal results are displayed) Labs Reviewed  COMPREHENSIVE METABOLIC PANEL - Abnormal; Notable for the following components:      Result Value   Glucose, Bld 577 (*)    BUN 39 (*)    Creatinine, Ser 1.70 (*)    Calcium 8.5 (*)    GFR, Estimated 38 (*)  All other components within normal limits  CBC WITH DIFFERENTIAL/PLATELET - Abnormal; Notable for the following components:   WBC 16.2 (*)    Lymphs Abs 9.5 (*)    All other components within normal limits  URINALYSIS, ROUTINE W REFLEX MICROSCOPIC - Abnormal; Notable for the following components:   Glucose, UA >=500 (*)    All other components within normal limits  URINALYSIS, MICROSCOPIC (REFLEX) - Abnormal; Notable for the following components:   Bacteria, UA RARE (*)    All other components within normal limits  TSH    EKG EKG Interpretation  Date/Time:  Saturday December 13 2021 14:04:47 EDT Ventricular Rate:  68 PR Interval:  133 QRS Duration: 99 QT Interval:  402 QTC Calculation: 428 R Axis:   13 Text Interpretation: Sinus rhythm Confirmed by Davonna Belling [phone]) on 12/13/2021 2:41:46  PM  Radiology DG Chest Portable 1 View  Result Date: 12/13/2021 CLINICAL DATA:  Fatigue, weakness, confusion EXAM: PORTABLE CHEST 1 VIEW COMPARISON:  Thigh 02/2016 FINDINGS: Cardiac size is within normal limits. There are no signs of pulmonary edema or focal pulmonary consolidation. There is no pleural effusion or pneumothorax. Degenerative changes are noted in both shoulders and both AC joints. IMPRESSION: No active disease. Electronically Signed   By: Elmer Picker M.D.   On: 12/13/2021 14:34    Procedures Procedures    Medications Ordered in ED Medications  sodium chloride 0.9 % bolus 1,000 mL (1,000 mLs Intravenous New Bag/Given 12/13/21 1507)  insulin aspart (novoLOG) injection 8 Units (8 Units Intravenous Given 12/13/21 1508)    ED Course/ Medical Decision Making/ A&P                           Medical Decision Making Amount and/or Complexity of Data Reviewed Labs: ordered. Radiology: ordered.  Risk Prescription drug management.   Patient generalized weakness and noncompliance with medication.  Differential diagnosis is long but includes infection, electrolyte abnormality, hyperglycemia.  Basic blood work done and interpreted does show hyperglycemia without DKA.  Urine does not show infection but does have glucose in it.  Sugar of 570 in the blood.  Will treat with fluids and give some IV insulin.  White count also mildly elevated but he does have history of CLL.  Chest x-ray reassuring.  Dependently interpreted by me.  If sugar comes down should be able to be discharged home hopefully with him restarting his medicines.  Care turned over to oncoming provider.        Final Clinical Impression(s) / ED Diagnoses Final diagnoses:  None    Rx / DC Orders ED Discharge Orders     None         Davonna Belling, MD 12/13/21 1515

## 2021-12-13 NOTE — ED Notes (Signed)
Patients son reports extreme fatigue.

## 2021-12-13 NOTE — ED Triage Notes (Signed)
Report extreme fatigue.some confusion last couple weeks

## 2021-12-13 NOTE — ED Notes (Signed)
ED Provider at bedside. 

## 2021-12-13 NOTE — ED Provider Notes (Signed)
Care transferred to me. Mild improvement with IV insulin and fluids. Will give a little more fluids after discussion with son. Will also do subcutaneous insulin to last longer. Repeat glucose after these interventions is 344. He feels well. Will prescribe a glucometer kit given he lost his. Appears stable for discharge.   Sherwood Gambler, MD 12/13/21 1723
